# Patient Record
Sex: Male | Born: 1975 | Race: Black or African American | Hispanic: No | Marital: Single | Smoking: Current every day smoker
Health system: Southern US, Community
[De-identification: ages and names within clinical notes are randomized; demographics above are authoritative.]

---

## 2008-06-18 ENCOUNTER — Ambulatory Visit: Payer: Self-pay | Admitting: Family Medicine

## 2008-07-23 ENCOUNTER — Emergency Department: Payer: Self-pay | Admitting: Emergency Medicine

## 2009-07-13 ENCOUNTER — Emergency Department: Payer: Self-pay | Admitting: Emergency Medicine

## 2010-10-20 ENCOUNTER — Emergency Department: Payer: Self-pay | Admitting: Internal Medicine

## 2011-09-11 IMAGING — CT CT CERVICAL SPINE WITHOUT CONTRAST
1 series · 12 of 14 positions shown, 15 images · non-contrast
Comparison: None

REASON FOR EXAM: neck pain
COMMENTS:

PROCEDURE:     CT  - CT CERVICAL SPINE WO  - October 20, 2010  [DATE]
RESULT:     Clinical Indication: Trauma
TECHNIQUE: Multiple axial CT images from the skull base to the mid vertebral
body of T1. obtained with sagittal and coronal reformatted images provided.

[Series 6: axial · axial · 0.33mm/px · z∈[-241,-81]mm · 12 of 97 slices shown, 15 images]
[im 8/97  soft-tissue]
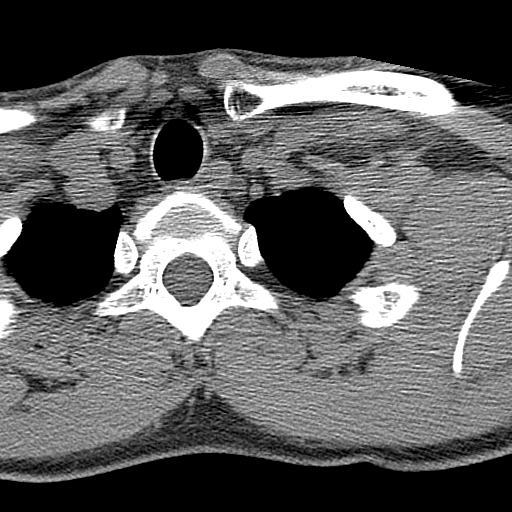
[im 8/97  bone]
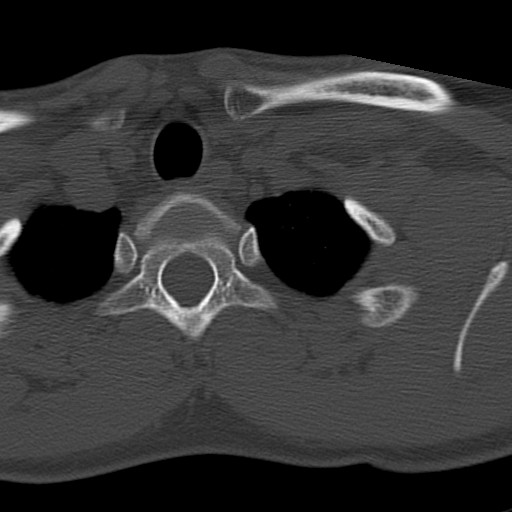
[im 15/97  bone]
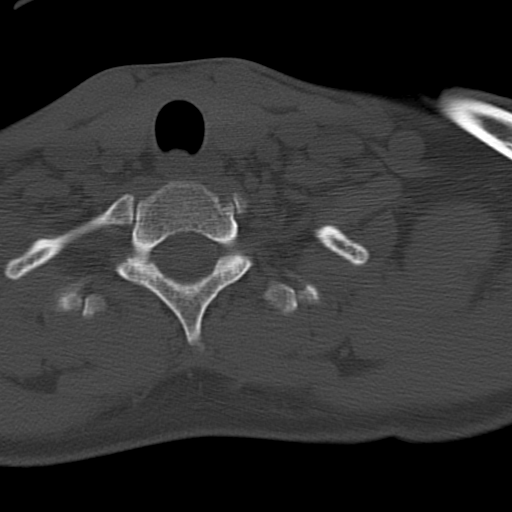
[im 23/97  bone]
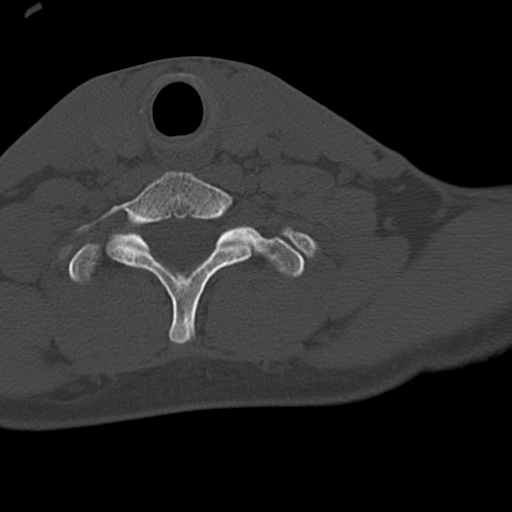
[im 30/97  bone]
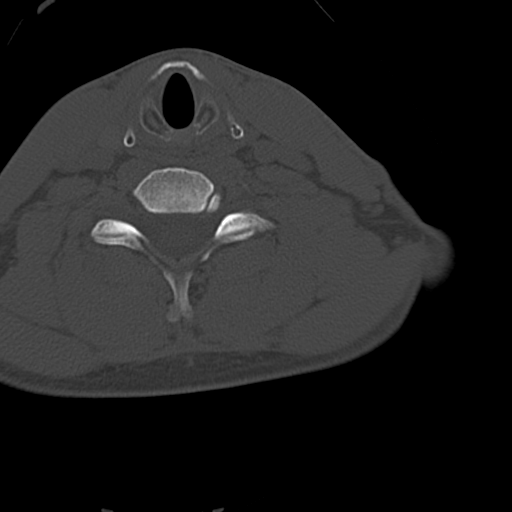
[im 37/97  soft-tissue]
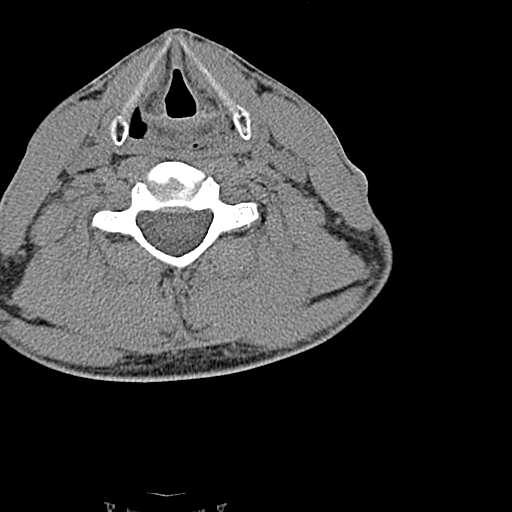
[im 37/97  bone]
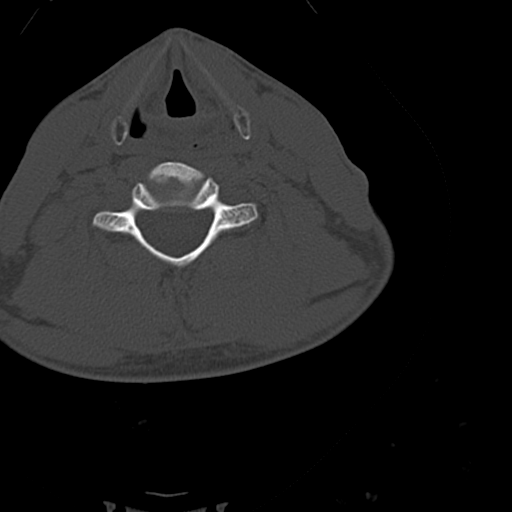
[im 45/97  bone]
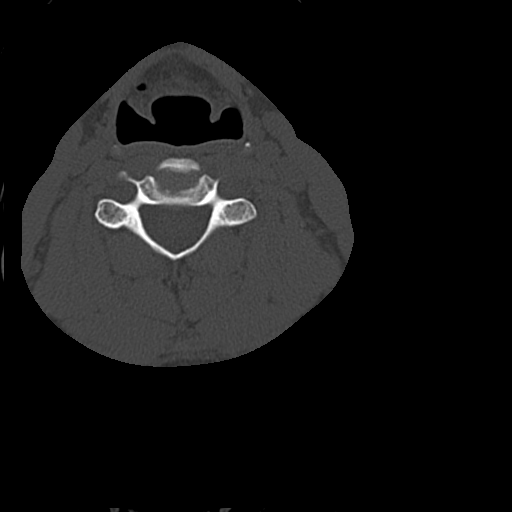
[im 52/97  bone]
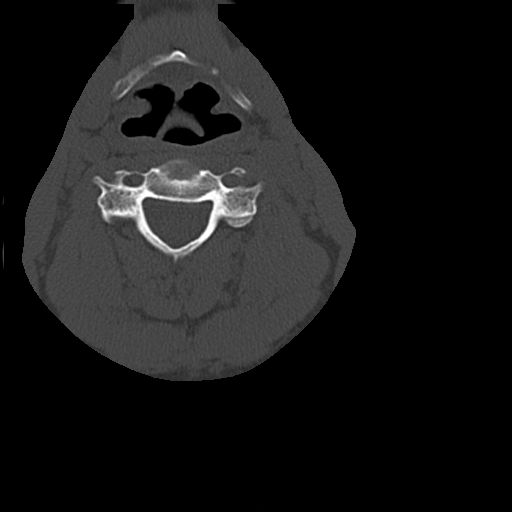
[im 60/97  bone]
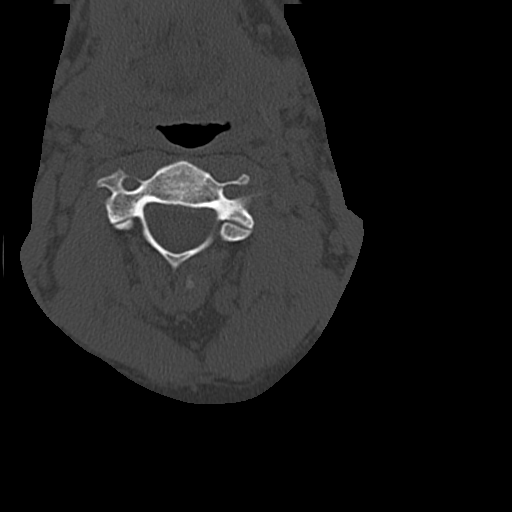
[im 67/97  soft-tissue]
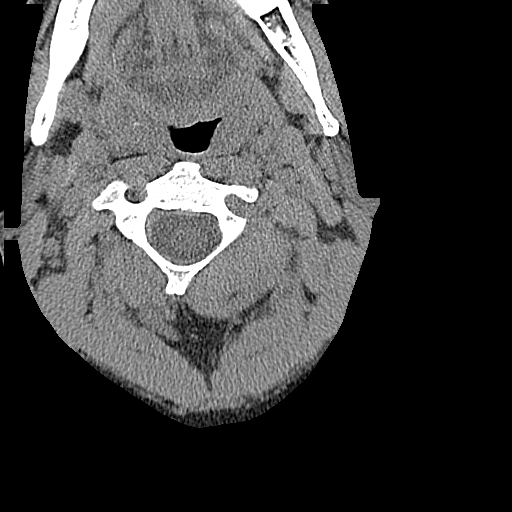
[im 67/97  bone]
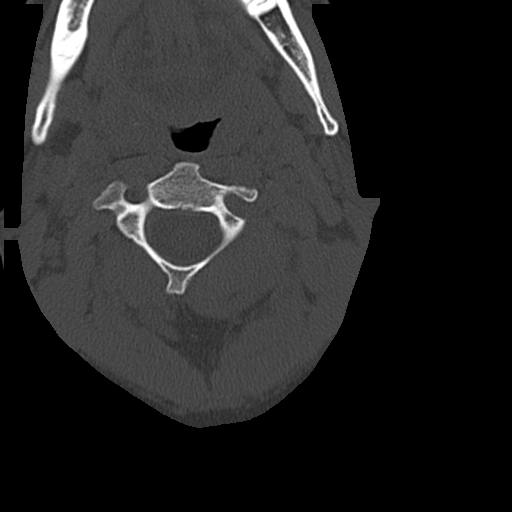
[im 74/97  bone]
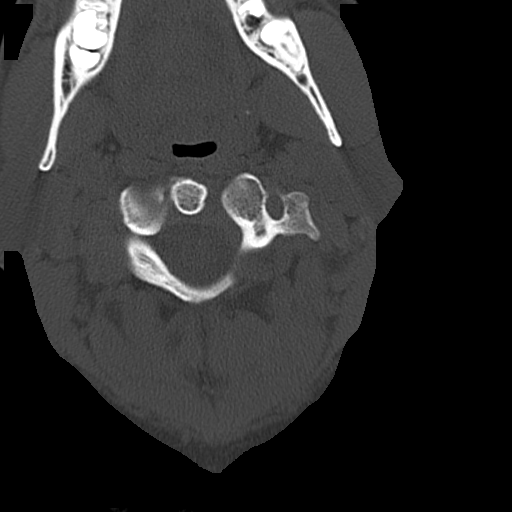
[im 82/97  bone]
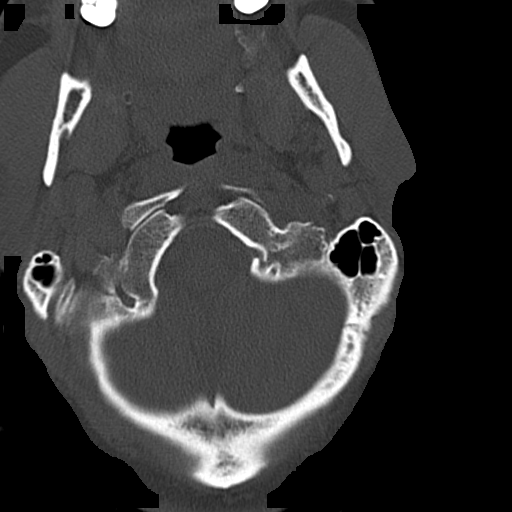
[im 89/97  bone]
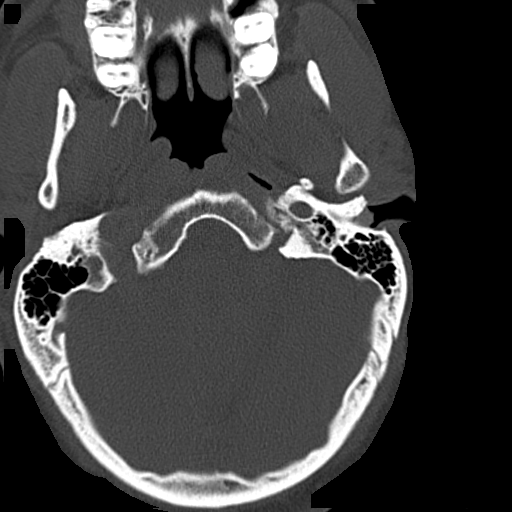

[12 of 14 positions shown; findings below may reference images not displayed]

FINDINGS: The alignment is anatomic. The vertebral body heights are maintained. There
is no acute fracture or static listhesis. The prevertebral soft tissues are
normal. The intraspinal soft tissues are not fully imaged on this
examination due to poor soft tissue contrast, but there is no soft tissue
gross abnormality.

The disc spaces are maintained.

The visualized portions of the lung apices demonstrate no focal abnormality.
IMPRESSION: 1. No acute osseous injury of the cervical spine.

2. Ligamentous injury is not evaluated. If there is high clinical concern
for ligamentous injury, consider MRI or flexion/extension radiographs as
clinically indicated and tolerated.

## 2012-05-15 ENCOUNTER — Emergency Department: Payer: Self-pay | Admitting: Emergency Medicine

## 2013-04-06 IMAGING — CR DG WRIST COMPLETE 3+V*R*
1 series · 4 of 4 positions shown · non-contrast
Comparison: none

REASON FOR EXAM: fall with wrist swelling and pain
COMMENTS:   LMP: (Male)

PROCEDURE:     DXR - DXR WRIST RT COMP WITH OBLIQUES  - May 15, 2012 [DATE]
RESULT:     Comparison: None

[Series 1: x wrist pa right · 0.14mm/px · 4 of 4 slices shown]
[im 1/4]
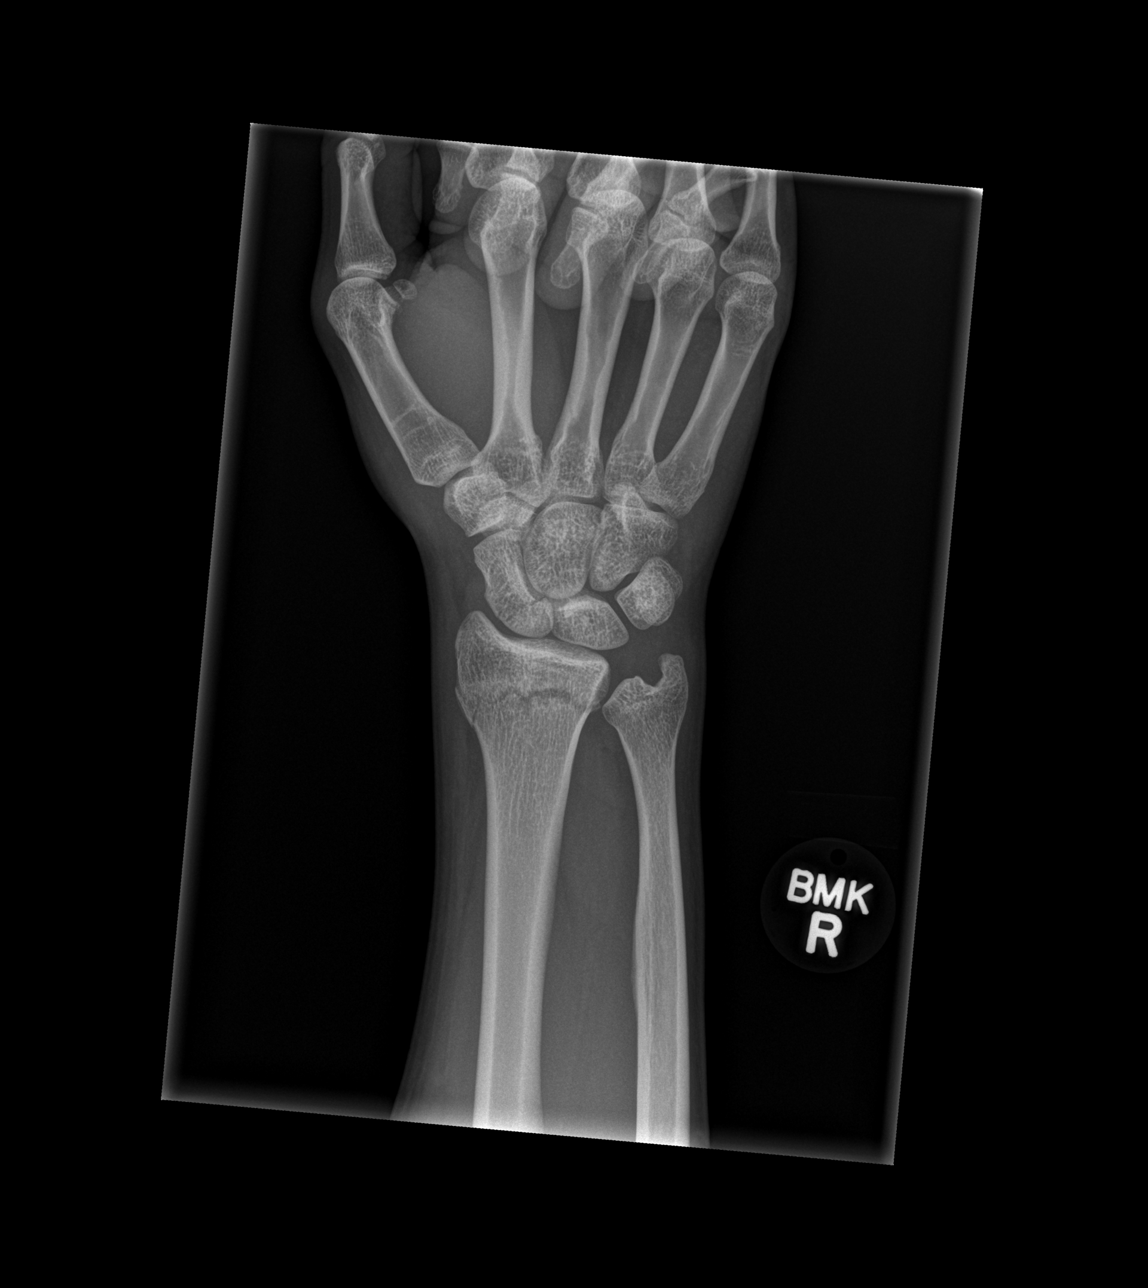
[im 2/4]
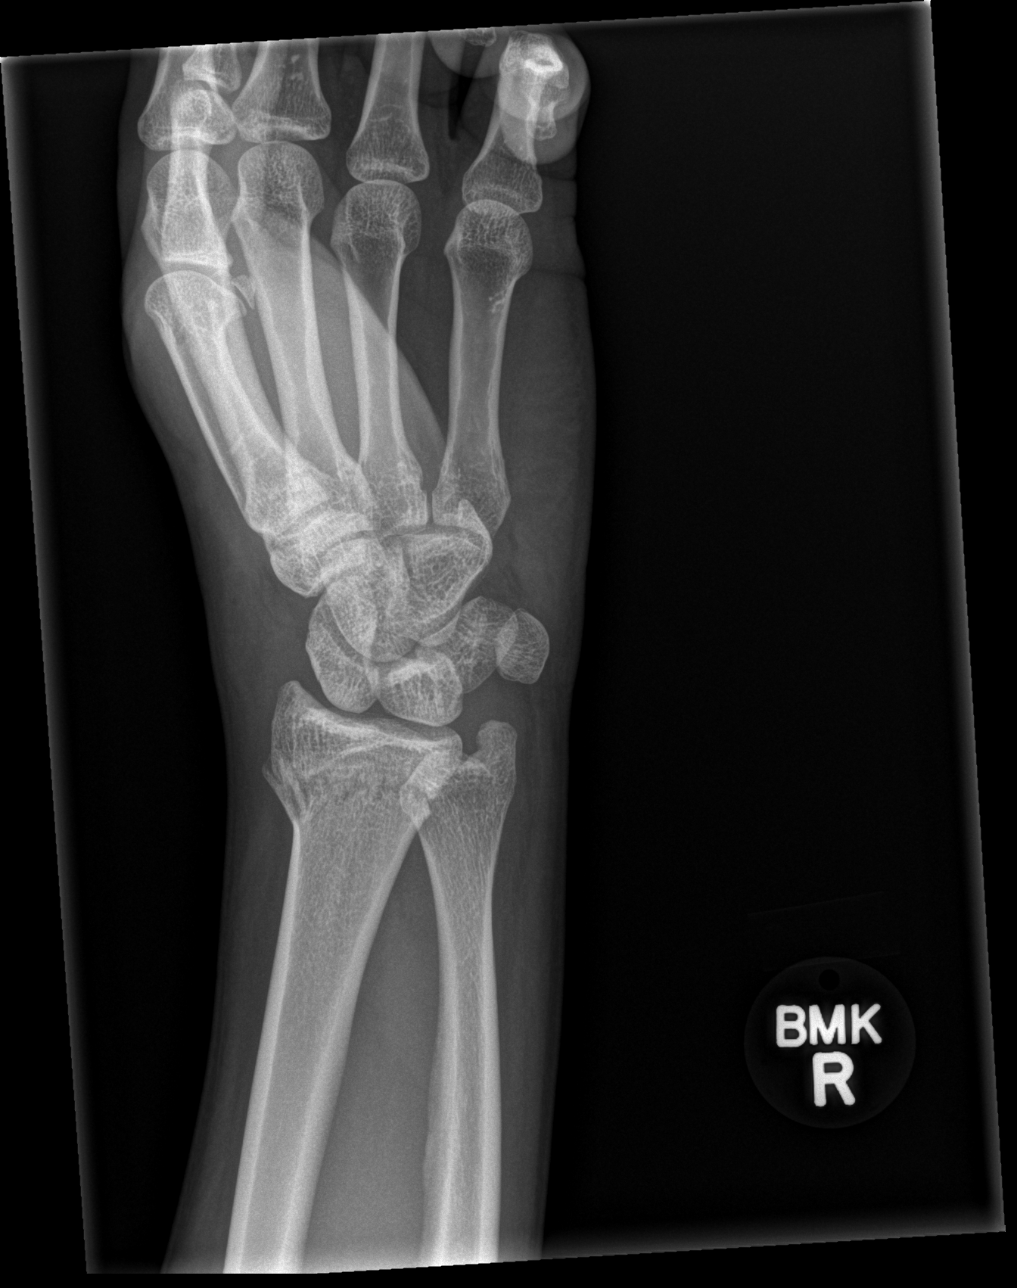
[im 3/4]
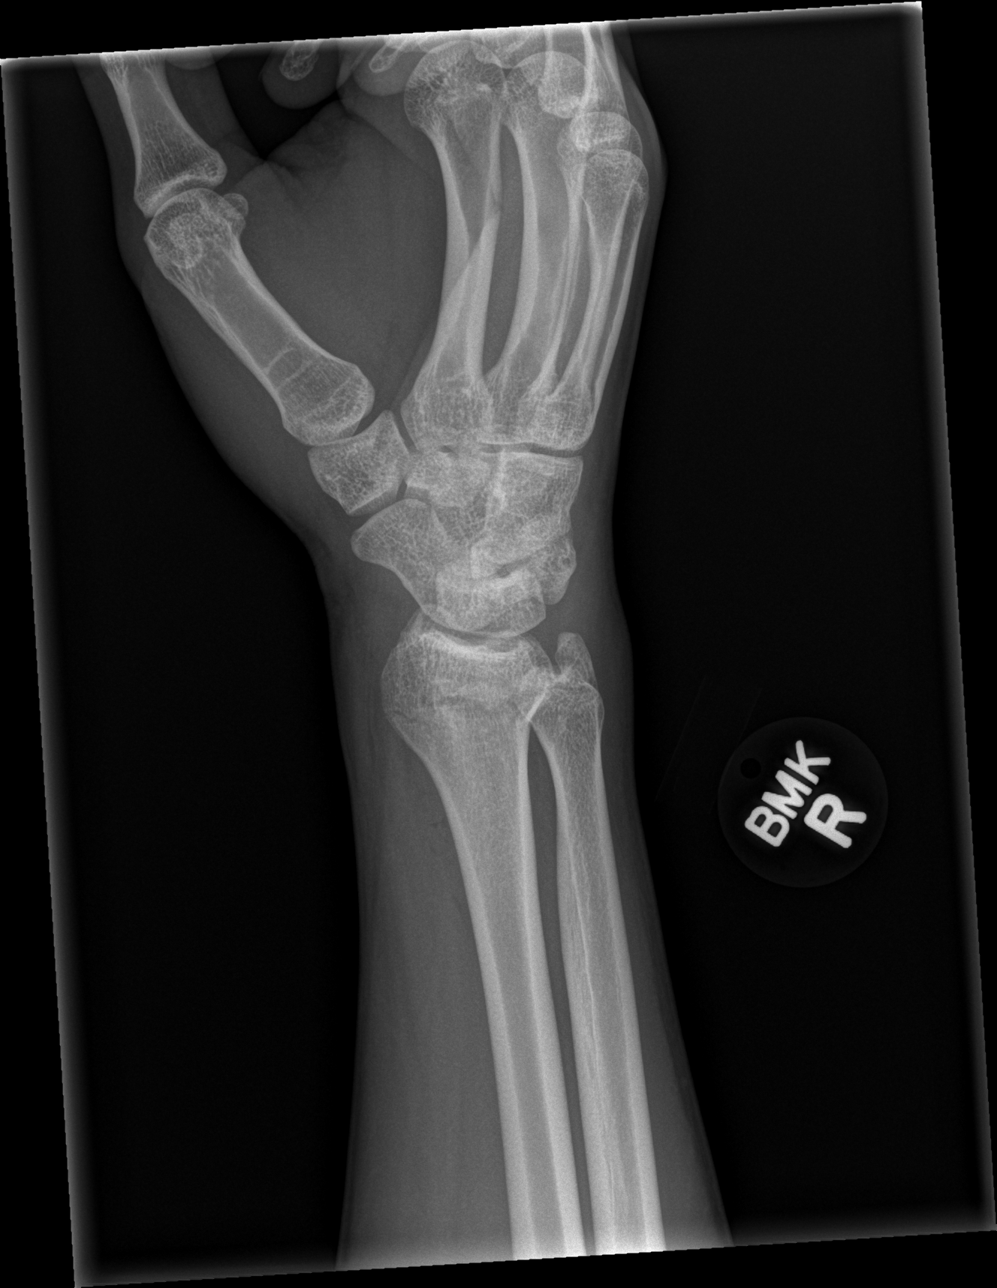
[im 4/4]
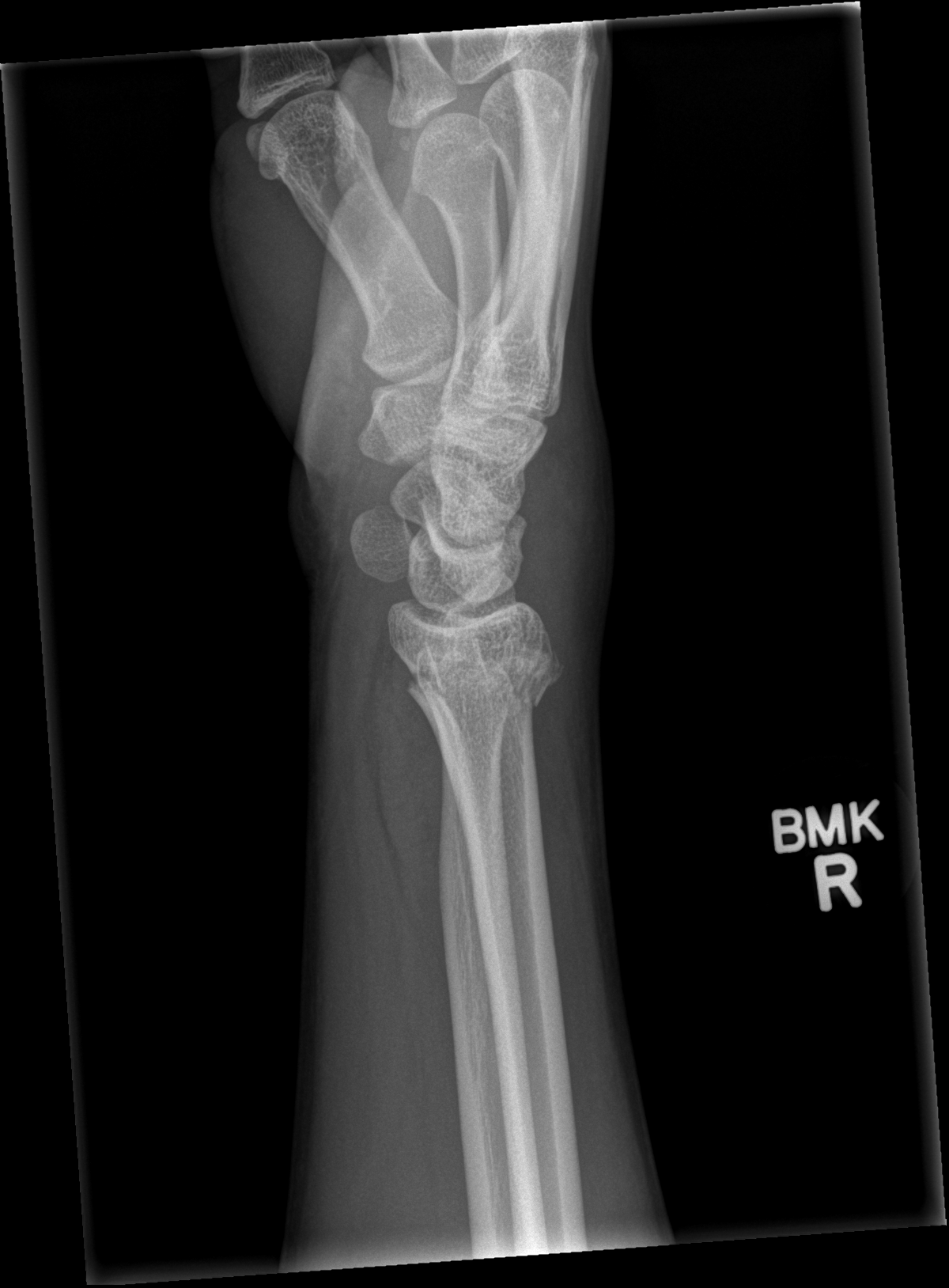

[4 of 4 positions shown; findings below may reference images not displayed]

FINDINGS: Four views of the right wrist demonstrates a mildly comminuted fracture of
the distal radial metaphysis. The fracture cleft extends to the distal
radial ulnar joint. There is no angulation. The joint spaces are maintained.
IMPRESSION: Mildly comminuted fracture of the right distal radial metaphysis.

If there is clinical concern regarding a radiographically occult scaphoid
fracture, snuff box tenderness, or ligamentous injury, further assessment
with MRI is recommended.

[REDACTED]

## 2019-03-07 ENCOUNTER — Other Ambulatory Visit: Payer: Self-pay

## 2019-03-07 ENCOUNTER — Ambulatory Visit: Payer: Managed Care, Other (non HMO) | Attending: Internal Medicine

## 2019-05-27 ENCOUNTER — Ambulatory Visit: Payer: Self-pay | Admitting: Nurse Practitioner

## 2019-05-28 ENCOUNTER — Telehealth: Payer: Self-pay | Admitting: Nurse Practitioner

## 2019-05-28 NOTE — Telephone Encounter (Signed)
Scheduled 06/06/19 10:30 advised of no show policy

## 2019-05-28 NOTE — Telephone Encounter (Signed)
Thank you. Noted.

## 2019-05-28 NOTE — Telephone Encounter (Signed)
Okay to reschedule x 1.

## 2019-05-28 NOTE — Telephone Encounter (Signed)
Pt presented in office today thinking that his appt was today at 2:00, advised pt that his appt was yesterday 05/27/19 at 2:00. Advised pt that normally we are unable to r/s, is it ok to r/s? Please advise.

## 2019-06-06 ENCOUNTER — Ambulatory Visit (INDEPENDENT_AMBULATORY_CARE_PROVIDER_SITE_OTHER): Payer: Managed Care, Other (non HMO) | Admitting: Nurse Practitioner

## 2019-06-06 ENCOUNTER — Encounter: Payer: Self-pay | Admitting: Nurse Practitioner

## 2019-06-06 ENCOUNTER — Other Ambulatory Visit: Payer: Self-pay

## 2019-06-06 VITALS — BP 146/95 | HR 69 | Temp 98.4°F | Ht 68.2 in | Wt 147.0 lb

## 2019-06-06 DIAGNOSIS — Z7289 Other problems related to lifestyle: Secondary | ICD-10-CM

## 2019-06-06 DIAGNOSIS — F1290 Cannabis use, unspecified, uncomplicated: Secondary | ICD-10-CM

## 2019-06-06 DIAGNOSIS — R030 Elevated blood-pressure reading, without diagnosis of hypertension: Secondary | ICD-10-CM | POA: Insufficient documentation

## 2019-06-06 DIAGNOSIS — Z7689 Persons encountering health services in other specified circumstances: Secondary | ICD-10-CM

## 2019-06-06 DIAGNOSIS — Z789 Other specified health status: Secondary | ICD-10-CM | POA: Insufficient documentation

## 2019-06-06 DIAGNOSIS — F17220 Nicotine dependence, chewing tobacco, uncomplicated: Secondary | ICD-10-CM | POA: Diagnosis not present

## 2019-06-06 DIAGNOSIS — F1090 Alcohol use, unspecified, uncomplicated: Secondary | ICD-10-CM

## 2019-06-06 NOTE — Assessment & Plan Note (Signed)
BP elevated in office today and at recent UC visit on review.  Suspect some underlying HTN, family history of this.  Recommend heavy focus on DASH diet and cutting back on smoking and alcohol intake over next 4 weeks.  Advised him to monitor BP at few days a week at home and document for provider.  If ongoing elevation at home and in office in 4 weeks, discussed possible need to initiate medication -- consider Losartan or Amlodipine.  Return in 4 weeks for annual physical and labs.

## 2019-06-06 NOTE — Assessment & Plan Note (Signed)
Drinks two 32 oz beer a day, recommend cutting back on this which would be beneficial to BP and overall health.  Will obtain labs next visit, check LFT.

## 2019-06-06 NOTE — Assessment & Plan Note (Signed)
I have recommended complete cessation of tobacco use. I have discussed various options available for assistance with tobacco cessation including over the counter methods (Nicotine gum, patch and lozenges). We also discussed prescription options (Chantix, Nicotine Inhaler / Nasal Spray). The patient is not interested in pursuing any prescription tobacco cessation options at this time.  He is working on cutting back at this time.

## 2019-06-06 NOTE — Assessment & Plan Note (Signed)
Daily use, recommend cut back on this for overall health benefit.

## 2019-06-06 NOTE — Patient Instructions (Signed)
DASH Eating Plan DASH stands for "Dietary Approaches to Stop Hypertension." The DASH eating plan is a healthy eating plan that has been shown to reduce high blood pressure (hypertension). It may also reduce your risk for type 2 diabetes, heart disease, and stroke. The DASH eating plan may also help with weight loss. What are tips for following this plan?  General guidelines  Avoid eating more than 2,300 mg (milligrams) of salt (sodium) a day. If you have hypertension, you may need to reduce your sodium intake to 1,500 mg a day.  Limit alcohol intake to no more than 1 drink a day for nonpregnant women and 2 drinks a day for men. One drink equals 12 oz of beer, 5 oz of wine, or 1 oz of hard liquor.  Work with your health care provider to maintain a healthy body weight or to lose weight. Ask what an ideal weight is for you.  Get at least 30 minutes of exercise that causes your heart to beat faster (aerobic exercise) most days of the week. Activities may include walking, swimming, or biking.  Work with your health care provider or diet and nutrition specialist (dietitian) to adjust your eating plan to your individual calorie needs. Reading food labels   Check food labels for the amount of sodium per serving. Choose foods with less than 5 percent of the Daily Value of sodium. Generally, foods with less than 300 mg of sodium per serving fit into this eating plan.  To find whole grains, look for the word "whole" as the first word in the ingredient list. Shopping  Buy products labeled as "low-sodium" or "no salt added."  Buy fresh foods. Avoid canned foods and premade or frozen meals. Cooking  Avoid adding salt when cooking. Use salt-free seasonings or herbs instead of table salt or sea salt. Check with your health care provider or pharmacist before using salt substitutes.  Do not fry foods. Cook foods using healthy methods such as baking, boiling, grilling, and broiling instead.  Cook with  heart-healthy oils, such as olive, canola, soybean, or sunflower oil. Meal planning  Eat a balanced diet that includes: ? 5 or more servings of fruits and vegetables each day. At each meal, try to fill half of your plate with fruits and vegetables. ? Up to 6-8 servings of whole grains each day. ? Less than 6 oz of lean meat, poultry, or fish each day. A 3-oz serving of meat is about the same size as a deck of cards. One egg equals 1 oz. ? 2 servings of low-fat dairy each day. ? A serving of nuts, seeds, or beans 5 times each week. ? Heart-healthy fats. Healthy fats called Omega-3 fatty acids are found in foods such as flaxseeds and coldwater fish, like sardines, salmon, and mackerel.  Limit how much you eat of the following: ? Canned or prepackaged foods. ? Food that is high in trans fat, such as fried foods. ? Food that is high in saturated fat, such as fatty meat. ? Sweets, desserts, sugary drinks, and other foods with added sugar. ? Full-fat dairy products.  Do not salt foods before eating.  Try to eat at least 2 vegetarian meals each week.  Eat more home-cooked food and less restaurant, buffet, and fast food.  When eating at a restaurant, ask that your food be prepared with less salt or no salt, if possible. What foods are recommended? The items listed may not be a complete list. Talk with your dietitian about   what dietary choices are best for you. Grains Whole-grain or whole-wheat bread. Whole-grain or whole-wheat pasta. Brown rice. Oatmeal. Quinoa. Bulgur. Whole-grain and low-sodium cereals. Pita bread. Low-fat, low-sodium crackers. Whole-wheat flour tortillas. Vegetables Fresh or frozen vegetables (raw, steamed, roasted, or grilled). Low-sodium or reduced-sodium tomato and vegetable juice. Low-sodium or reduced-sodium tomato sauce and tomato paste. Low-sodium or reduced-sodium canned vegetables. Fruits All fresh, dried, or frozen fruit. Canned fruit in natural juice (without  added sugar). Meat and other protein foods Skinless chicken or turkey. Ground chicken or turkey. Pork with fat trimmed off. Fish and seafood. Egg whites. Dried beans, peas, or lentils. Unsalted nuts, nut butters, and seeds. Unsalted canned beans. Lean cuts of beef with fat trimmed off. Low-sodium, lean deli meat. Dairy Low-fat (1%) or fat-free (skim) milk. Fat-free, low-fat, or reduced-fat cheeses. Nonfat, low-sodium ricotta or cottage cheese. Low-fat or nonfat yogurt. Low-fat, low-sodium cheese. Fats and oils Soft margarine without trans fats. Vegetable oil. Low-fat, reduced-fat, or light mayonnaise and salad dressings (reduced-sodium). Canola, safflower, olive, soybean, and sunflower oils. Avocado. Seasoning and other foods Herbs. Spices. Seasoning mixes without salt. Unsalted popcorn and pretzels. Fat-free sweets. What foods are not recommended? The items listed may not be a complete list. Talk with your dietitian about what dietary choices are best for you. Grains Baked goods made with fat, such as croissants, muffins, or some breads. Dry pasta or rice meal packs. Vegetables Creamed or fried vegetables. Vegetables in a cheese sauce. Regular canned vegetables (not low-sodium or reduced-sodium). Regular canned tomato sauce and paste (not low-sodium or reduced-sodium). Regular tomato and vegetable juice (not low-sodium or reduced-sodium). Pickles. Olives. Fruits Canned fruit in a light or heavy syrup. Fried fruit. Fruit in cream or butter sauce. Meat and other protein foods Fatty cuts of meat. Ribs. Fried meat. Bacon. Sausage. Bologna and other processed lunch meats. Salami. Fatback. Hotdogs. Bratwurst. Salted nuts and seeds. Canned beans with added salt. Canned or smoked fish. Whole eggs or egg yolks. Chicken or turkey with skin. Dairy Whole or 2% milk, cream, and half-and-half. Whole or full-fat cream cheese. Whole-fat or sweetened yogurt. Full-fat cheese. Nondairy creamers. Whipped toppings.  Processed cheese and cheese spreads. Fats and oils Butter. Stick margarine. Lard. Shortening. Ghee. Bacon fat. Tropical oils, such as coconut, palm kernel, or palm oil. Seasoning and other foods Salted popcorn and pretzels. Onion salt, garlic salt, seasoned salt, table salt, and sea salt. Worcestershire sauce. Tartar sauce. Barbecue sauce. Teriyaki sauce. Soy sauce, including reduced-sodium. Steak sauce. Canned and packaged gravies. Fish sauce. Oyster sauce. Cocktail sauce. Horseradish that you find on the shelf. Ketchup. Mustard. Meat flavorings and tenderizers. Bouillon cubes. Hot sauce and Tabasco sauce. Premade or packaged marinades. Premade or packaged taco seasonings. Relishes. Regular salad dressings. Where to find more information:  National Heart, Lung, and Blood Institute: www.nhlbi.nih.gov  American Heart Association: www.heart.org Summary  The DASH eating plan is a healthy eating plan that has been shown to reduce high blood pressure (hypertension). It may also reduce your risk for type 2 diabetes, heart disease, and stroke.  With the DASH eating plan, you should limit salt (sodium) intake to 2,300 mg a day. If you have hypertension, you may need to reduce your sodium intake to 1,500 mg a day.  When on the DASH eating plan, aim to eat more fresh fruits and vegetables, whole grains, lean proteins, low-fat dairy, and heart-healthy fats.  Work with your health care provider or diet and nutrition specialist (dietitian) to adjust your eating plan to your   individual calorie needs. This information is not intended to replace advice given to you by your health care provider. Make sure you discuss any questions you have with your health care provider. Document Revised: 01/26/2017 Document Reviewed: 02/07/2016 Elsevier Patient Education  2020 Elsevier Inc.  

## 2019-06-06 NOTE — Progress Notes (Signed)
New Patient Office Visit  Subjective:  Patient ID: Donald Oconnor, male    DOB: 18-Sep-1975  Age: 44 y.o. MRN: [id number]  CC:  Chief Complaint  Patient presents with  . Establish Care    HPI Donald Oconnor presents for new patient visit to establish care.  Introduced to Designer, jewellery role and practice setting.  All questions answered.  Has not had provider in past.    Denies any major health issues.  Does smoke MJ daily + drinks two 32 oz beer daily + smokes < 1/2 PPD, has smoked since age 24.  Went cold [place] September of last year, but started back at small amount and is working on cutting back.    History reviewed. No pertinent past medical history.  History reviewed. No pertinent surgical history.  Family History  Problem Relation Age of Onset  . Diabetes Mother   . Hypertension Mother   . Diabetes Brother   . Diabetes Maternal Grandmother   . Hypertension Maternal Grandfather     Social History   Socioeconomic History  . Marital status: Single    Spouse name: Not on file  . Number of children: Not on file  . Years of education: Not on file  . Highest education level: Not on file  Occupational History  . Not on file  Tobacco Use  . Smoking status: Current Every Day Smoker    Packs/day: 0.25    Types: Cigarettes  . Smokeless tobacco: Never Used  Substance and Sexual Activity  . Alcohol use: Yes    Alcohol/week: 14.0 standard drinks    Types: 14 Cans of beer per week    Comment: two 32 oz a day  . Drug use: Yes    Types: Marijuana    Comment: daily  . Sexual activity: Yes  Other Topics Concern  . Not on file  Social History Narrative  . Not on file   Social Determinants of Health   Financial Resource Strain: Low Risk   . Difficulty of Paying Living Expenses: Not hard at all  Food Insecurity: No Food Insecurity  . Worried About Charity fundraiser in the Last Year: Never true  . Ran Out of Food in the Last Year: Never true  Transportation  Needs: No Transportation Needs  . Lack of Transportation (Medical): No  . Lack of Transportation (Non-Medical): No  Physical Activity: Inactive  . Days of Exercise per Week: 0 days  . Minutes of Exercise per Session: 0 min  Stress: No Stress Concern Present  . Feeling of Stress : Not at all  Social Connections:   . Frequency of Communication with Friends and Family:   . Frequency of Social Gatherings with Friends and Family:   . Attends Religious Services:   . Active Member of Clubs or Organizations:   . Attends Archivist Meetings:   Marland Kitchen Marital Status:   Intimate Partner Violence:   . Fear of Current or Ex-Partner:   . Emotionally Abused:   Marland Kitchen Physically Abused:   . Sexually Abused:     ROS Review of Systems  Constitutional: Negative for activity change, diaphoresis, fatigue and fever.  Respiratory: Negative for cough, chest tightness, shortness of breath and wheezing.   Cardiovascular: Negative for chest pain, palpitations and leg swelling.  Gastrointestinal: Negative.   Endocrine: Negative.   Neurological: Negative.   Psychiatric/Behavioral: Negative.     Objective:   Today's Vitals: BP (!) 146/95   Pulse 69  Temp 98.4 F (36.9 C) (Oral)   Ht 5' 8.2" (1.732 m)   Wt 147 lb (66.7 kg)   SpO2 96%   BMI 22.22 kg/m   Physical Exam Vitals and nursing note reviewed.  Constitutional:      General: He is awake. He is not in acute distress.    Appearance: He is well-developed and well-groomed. He is not ill-appearing.  HENT:     Head: Normocephalic and atraumatic.     Right Ear: Hearing normal. No drainage.     Left Ear: Hearing normal. No drainage.  Eyes:     General: Lids are normal.        Right eye: No discharge.        Left eye: No discharge.     Conjunctiva/sclera: Conjunctivae normal.     Pupils: Pupils are equal, round, and reactive to light.  Neck:     Thyroid: No thyromegaly.     Vascular: No carotid bruit.  Cardiovascular:     Rate and  Rhythm: Normal rate and regular rhythm.     Heart sounds: Normal heart sounds, S1 normal and S2 normal. No murmur. No gallop.   Pulmonary:     Effort: Pulmonary effort is normal. No accessory muscle usage or respiratory distress.     Breath sounds: Normal breath sounds.  Abdominal:     General: Bowel sounds are normal.     Palpations: Abdomen is soft.  Musculoskeletal:        General: Normal range of motion.     Cervical back: Normal range of motion and neck supple.     Right lower leg: No edema.     Left lower leg: No edema.  Skin:    General: Skin is warm and dry.     Capillary Refill: Capillary refill takes less than 2 seconds.  Neurological:     Mental Status: He is alert and oriented to person, place, and time.     Deep Tendon Reflexes: Reflexes are normal and symmetric.  Psychiatric:        Attention and Perception: Attention normal.        Mood and Affect: Mood normal.        Speech: Speech normal.        Behavior: Behavior normal. Behavior is cooperative.        Thought Content: Thought content normal.     Assessment & Plan:   Problem List Items Addressed This Visit      Other   Elevated BP without diagnosis of hypertension    BP elevated in office today and at recent UC visit on review.  Suspect some underlying HTN, family history of this.  Recommend heavy focus on DASH diet and cutting back on smoking and alcohol intake over next 4 weeks.  Advised him to monitor BP at few days a week at home and document for provider.  If ongoing elevation at home and in office in 4 weeks, discussed possible need to initiate medication -- consider Losartan or Amlodipine.  Return in 4 weeks for annual physical and labs.      Nicotine dependence, chewing tobacco, uncomplicated    I have recommended complete cessation of tobacco use. I have discussed various options available for assistance with tobacco cessation including over the counter methods (Nicotine gum, patch and lozenges). We  also discussed prescription options (Chantix, Nicotine Inhaler / Nasal Spray). The patient is not interested in pursuing any prescription tobacco cessation options at this time.  He  is working on cutting back at this time.       Alcohol use    Drinks two 32 oz beer a day, recommend cutting back on this which would be beneficial to BP and overall health.  Will obtain labs next visit, check LFT.      Marijuana user    Daily use, recommend cut back on this for overall health benefit.       Other Visit Diagnoses    Encounter to establish care    -  Primary      No outpatient encounter medications on file as of 06/06/2019.   No facility-administered encounter medications on file as of 06/06/2019.    Follow-up: Return in about 4 weeks (around 07/04/2019) for Annual physical.   Venita Lick, NP

## 2019-07-04 ENCOUNTER — Encounter: Payer: Self-pay | Admitting: Nurse Practitioner

## 2019-07-04 ENCOUNTER — Other Ambulatory Visit: Payer: Self-pay

## 2019-07-04 ENCOUNTER — Ambulatory Visit (INDEPENDENT_AMBULATORY_CARE_PROVIDER_SITE_OTHER): Payer: Managed Care, Other (non HMO) | Admitting: Nurse Practitioner

## 2019-07-04 VITALS — BP 124/85 | HR 72 | Temp 98.8°F | Ht 70.0 in | Wt 146.6 lb

## 2019-07-04 DIAGNOSIS — Z13220 Encounter for screening for lipoid disorders: Secondary | ICD-10-CM

## 2019-07-04 DIAGNOSIS — Z1329 Encounter for screening for other suspected endocrine disorder: Secondary | ICD-10-CM

## 2019-07-04 DIAGNOSIS — Z7289 Other problems related to lifestyle: Secondary | ICD-10-CM

## 2019-07-04 DIAGNOSIS — M545 Low back pain, unspecified: Secondary | ICD-10-CM

## 2019-07-04 DIAGNOSIS — Z125 Encounter for screening for malignant neoplasm of prostate: Secondary | ICD-10-CM | POA: Diagnosis not present

## 2019-07-04 DIAGNOSIS — F17220 Nicotine dependence, chewing tobacco, uncomplicated: Secondary | ICD-10-CM | POA: Diagnosis not present

## 2019-07-04 DIAGNOSIS — R030 Elevated blood-pressure reading, without diagnosis of hypertension: Secondary | ICD-10-CM | POA: Diagnosis not present

## 2019-07-04 DIAGNOSIS — Z789 Other specified health status: Secondary | ICD-10-CM

## 2019-07-04 DIAGNOSIS — F1290 Cannabis use, unspecified, uncomplicated: Secondary | ICD-10-CM

## 2019-07-04 DIAGNOSIS — M549 Dorsalgia, unspecified: Secondary | ICD-10-CM | POA: Insufficient documentation

## 2019-07-04 DIAGNOSIS — Z0000 Encounter for general adult medical examination without abnormal findings: Secondary | ICD-10-CM

## 2019-07-04 MED ORDER — METHOCARBAMOL 500 MG PO TABS: 500.0000 mg | ORAL_TABLET | Freq: Three times a day (TID) | ORAL | 0 refills | Status: DC | PRN

## 2019-07-04 NOTE — Assessment & Plan Note (Signed)
Daily use, recommend cut back on this for overall health benefit.

## 2019-07-04 NOTE — Patient Instructions (Signed)
Healthy Eating Following a healthy eating pattern may help you to achieve and maintain a healthy body weight, reduce the risk of chronic disease, and live a long and productive life. It is important to follow a healthy eating pattern at an appropriate calorie level for your body. Your nutritional needs should be met primarily through food by choosing a variety of nutrient-rich foods. What are tips for following this plan? Reading food labels  Read labels and choose the following: ? Reduced or low sodium. ? Juices with 100% fruit juice. ? Foods with low saturated fats and high polyunsaturated and monounsaturated fats. ? Foods with whole grains, such as whole wheat, cracked wheat, brown rice, and wild rice. ? Whole grains that are fortified with folic acid. This is recommended for women who are pregnant or who want to become pregnant.  Read labels and avoid the following: ? Foods with a lot of added sugars. These include foods that contain brown sugar, corn sweetener, corn syrup, dextrose, fructose, glucose, high-fructose corn syrup, honey, invert sugar, lactose, malt syrup, maltose, molasses, raw sugar, sucrose, trehalose, or turbinado sugar.  Do not eat more than the following amounts of added sugar per day:  6 teaspoons (25 g) for women.  9 teaspoons (38 g) for men. ? Foods that contain processed or refined starches and grains. ? Refined grain products, such as white flour, degermed cornmeal, white bread, and white rice. Shopping  Choose nutrient-rich snacks, such as vegetables, whole fruits, and nuts. Avoid high-calorie and high-sugar snacks, such as potato chips, fruit snacks, and candy.  Use oil-based dressings and spreads on foods instead of solid fats such as butter, stick margarine, or cream cheese.  Limit pre-made sauces, mixes, and "instant" products such as flavored rice, instant noodles, and ready-made pasta.  Try more plant-protein sources, such as tofu, tempeh, black beans,  edamame, lentils, nuts, and seeds.  Explore eating plans such as the Mediterranean diet or vegetarian diet. Cooking  Use oil to saut or stir-fry foods instead of solid fats such as butter, stick margarine, or lard.  Try baking, boiling, grilling, or broiling instead of frying.  Remove the fatty part of meats before cooking.  Steam vegetables in water or broth. Meal planning   At meals, imagine dividing your plate into fourths: ? One-half of your plate is fruits and vegetables. ? One-fourth of your plate is whole grains. ? One-fourth of your plate is protein, especially lean meats, poultry, eggs, tofu, beans, or nuts.  Include low-fat dairy as part of your daily diet. Lifestyle  Choose healthy options in all settings, including home, work, school, restaurants, or stores.  Prepare your food safely: ? Wash your hands after handling raw meats. ? Keep food preparation surfaces clean by regularly washing with hot, soapy water. ? Keep raw meats separate from ready-to-eat foods, such as fruits and vegetables. ? Cook seafood, meat, poultry, and eggs to the recommended internal temperature. ? Store foods at safe temperatures. In general:  Keep cold foods at 59F (4.4C) or below.  Keep hot foods at 159F (60C) or above.  Keep your freezer at [facility] (-17.8C) or below.  Foods are no longer safe to eat when they have been between the temperatures of 40-159F (4.4-60C) for more than 2 hours. What foods should I eat? Fruits Aim to eat 2 cup-equivalents of fresh, canned (in natural juice), or frozen fruits each day. Examples of 1 cup-equivalent of fruit include 1 small apple, 8 large strawberries, 1 cup canned fruit,  cup  dried fruit, or 1 cup 100% juice. Vegetables Aim to eat 2-3 cup-equivalents of fresh and frozen vegetables each day, including different varieties and colors. Examples of 1 cup-equivalent of vegetables include 2 medium carrots, 2 cups raw, leafy greens, 1 cup chopped  vegetable (raw or cooked), or 1 medium baked potato. Grains Aim to eat 6 ounce-equivalents of whole grains each day. Examples of 1 ounce-equivalent of grains include 1 slice of bread, 1 cup ready-to-eat cereal, 3 cups popcorn, or  cup cooked rice, pasta, or cereal. Meats and other proteins Aim to eat 5-6 ounce-equivalents of protein each day. Examples of 1 ounce-equivalent of protein include 1 egg, 1/2 cup nuts or seeds, or 1 tablespoon (16 g) peanut butter. A cut of meat or fish that is the size of a deck of cards is about 3-4 ounce-equivalents.  Of the protein you eat each week, try to have at least 8 ounces come from seafood. This includes salmon, trout, herring, and anchovies. Dairy Aim to eat 3 cup-equivalents of fat-free or low-fat dairy each day. Examples of 1 cup-equivalent of dairy include 1 cup (240 mL) milk, 8 ounces (250 g) yogurt, 1 ounces (44 g) natural cheese, or 1 cup (240 mL) fortified soy milk. Fats and oils  Aim for about 5 teaspoons (21 g) per day. Choose monounsaturated fats, such as canola and olive oils, avocados, peanut butter, and most nuts, or polyunsaturated fats, such as sunflower, corn, and soybean oils, walnuts, pine nuts, sesame seeds, sunflower seeds, and flaxseed. Beverages  Aim for six 8-oz glasses of water per day. Limit coffee to three to five 8-oz cups per day.  Limit caffeinated beverages that have added calories, such as soda and energy drinks.  Limit alcohol intake to no more than 1 drink a day for nonpregnant women and 2 drinks a day for men. One drink equals 12 oz of beer (355 mL), 5 oz of wine (148 mL), or 1 oz of hard liquor (44 mL). Seasoning and other foods  Avoid adding excess amounts of salt to your foods. Try flavoring foods with herbs and spices instead of salt.  Avoid adding sugar to foods.  Try using oil-based dressings, sauces, and spreads instead of solid fats. This information is based on general [place] nutrition guidelines. For more  information, visit [employer]. Exact amounts may vary based on your nutrition needs. Summary  A healthy eating plan may help you to maintain a healthy weight, reduce the risk of chronic diseases, and stay active throughout your life.  Plan your meals. Make sure you eat the right portions of a variety of nutrient-rich foods.  Try baking, boiling, grilling, or broiling instead of frying.  Choose healthy options in all settings, including home, work, school, restaurants, or stores. This information is not intended to replace advice given to you by your health care provider. Make sure you discuss any questions you have with your health care provider. Document Revised: 05/28/2017 Document Reviewed: 05/28/2017 Elsevier Patient Education  [place].

## 2019-07-04 NOTE — Assessment & Plan Note (Signed)
BP at goal today.  No medications.  Continue DASH diet focus and monitoring at home.  CBC and TSH today.

## 2019-07-04 NOTE — Assessment & Plan Note (Signed)
Drinks two 32 oz beer a day, recommend cutting back on this which would be beneficial to BP and overall health.  Will obtain CMP today.

## 2019-07-04 NOTE — Assessment & Plan Note (Signed)
I have recommended complete cessation of tobacco use. I have discussed various options available for assistance with tobacco cessation including over the counter methods (Nicotine gum, patch and lozenges). We also discussed prescription options (Chantix, Nicotine Inhaler / Nasal Spray). The patient is not interested in pursuing any prescription tobacco cessation options at this time.  

## 2019-07-04 NOTE — Assessment & Plan Note (Signed)
Acute x 24 hours.  No red flag symptoms.  Will send in short period of Robaxin, script sent.  Recommend alternate ice and heat at home.  May take Tylenol as needed for comfort.  Recommend gentle stretches, yoga at home to help keep back stable.  Return to office for worsening or ongoing pain.

## 2019-07-04 NOTE — Progress Notes (Signed)
BP 124/85 (BP Location: Left Arm, Cuff Size: Normal)   Pulse 72   Temp 98.8 F (37.1 C) (Oral)   Ht 5\' 10"  (1.778 m)   Wt 146 lb 9.6 oz (66.5 kg)   SpO2 98%   BMI 21.03 kg/m    Subjective:    Patient ID: Donald Oconnor, male    DOB: [birth year], 44 y.o.   MRN: [id number]  HPI: Donald Oconnor is a 44 y.o. male presenting on 07/04/2019 for comprehensive medical examination. Current medical complaints include:none  He currently lives with: none Interim Problems from his last visit: no   Continues to smoke, 4 cigarettes a day, is trying to quit on own and not interested in medications.  Drinks two 36 oz beer a day and is attempting to cut back.  BACK PAIN Yesterday was coating automotive header, as he was pulling it out he felt a pull to lower back.  Mainly to right lower side.  Has history of similar back issues, first time at 86-58 years old after sleeping on couch.  Gets back issues/pulls 2-3 times a year.  Has episodes since he was little of passing out in driveway, last one was a couple years ago.  Feels like when he stands up too fast, he gets dizzy.   Duration: days Mechanism of injury: lifting Location: Right and low back Onset: sudden Severity: 6/10 Quality: sharp, aching and throbbing Frequency: intermittent Radiation: none Aggravating factors: lifting, movement and walking Alleviating factors: nothing Status: stable Treatments attempted: none  Relief with NSAIDs?: No NSAIDs Taken Nighttime pain:  no Paresthesias / decreased sensation:  no Bowel / bladder incontinence:  no Fevers:  no Dysuria / urinary frequency:  no   Functional Status Survey: Is the patient deaf or have difficulty hearing?: No Does the patient have difficulty seeing, even when wearing glasses/contacts?: No Does the patient have difficulty concentrating, remembering, or making decisions?: No Does the patient have difficulty walking or climbing stairs?: No Does the patient have difficulty  dressing or bathing?: No Does the patient have difficulty doing errands alone such as visiting a doctor's office or shopping?: No  FALL RISK: Fall Risk  07/04/2019  Falls in the past year? 0  Number falls in past yr: 0  Injury with Fall? 0  Follow up Falls evaluation completed    Depression Screen Depression screen [facility] 2/9 07/04/2019 06/06/2019  Decreased Interest 0 0  Down, Depressed, Hopeless 0 0  PHQ - 2 Score 0 0  Altered sleeping - 0  Tired, decreased energy - 0  Change in appetite - 0  Feeling bad or failure about yourself  - 0  Trouble concentrating - 0  Moving slowly or fidgety/restless - 0  Suicidal thoughts - 0  PHQ-9 Score - 0    Advanced Directives <no information>  Past Medical History:  History reviewed. No pertinent past medical history.  Surgical History:  History reviewed. No pertinent surgical history.  Medications:  No current outpatient medications on file prior to visit.   No current facility-administered medications on file prior to visit.    Allergies:  No Known Allergies  Social History:  Social History   Socioeconomic History  . Marital status: Single    Spouse name: Not on file  . Number of children: Not on file  . Years of education: Not on file  . Highest education level: Not on file  Occupational History  . Not on file  Tobacco Use  . Smoking status: Current  Every Day Smoker    Packs/day: 0.25    Types: Cigarettes  . Smokeless tobacco: Never Used  Substance and Sexual Activity  . Alcohol use: Yes    Alcohol/week: 14.0 standard drinks    Types: 14 Cans of beer per week    Comment: two 32 oz a day  . Drug use: Yes    Types: Marijuana    Comment: daily  . Sexual activity: Yes  Other Topics Concern  . Not on file  Social History Narrative  . Not on file   Social Determinants of Health   Financial Resource Strain: Low Risk   . Difficulty of Paying Living Expenses: Not hard at all  Food Insecurity: No Food Insecurity  .  Worried About Charity fundraiser in the Last Year: Never true  . Ran Out of Food in the Last Year: Never true  Transportation Needs: No Transportation Needs  . Lack of Transportation (Medical): No  . Lack of Transportation (Non-Medical): No  Physical Activity: Inactive  . Days of Exercise per Week: 0 days  . Minutes of Exercise per Session: 0 min  Stress: No Stress Concern Present  . Feeling of Stress : Not at all  Social Connections:   . Frequency of Communication with Friends and Family:   . Frequency of Social Gatherings with Friends and Family:   . Attends Religious Services:   . Active Member of Clubs or Organizations:   . Attends Archivist Meetings:   Marland Kitchen Marital Status:   Intimate Partner Violence:   . Fear of Current or Ex-Partner:   . Emotionally Abused:   Marland Kitchen Physically Abused:   . Sexually Abused:    Social History   Tobacco Use  Smoking Status Current Every Day Smoker  . Packs/day: 0.25  . Types: Cigarettes  Smokeless Tobacco Never Used   Social History   Substance and Sexual Activity  Alcohol Use Yes  . Alcohol/week: 14.0 standard drinks  . Types: 14 Cans of beer per week   Comment: two 32 oz a day    Family History:  Family History  Problem Relation Age of Onset  . Diabetes Mother   . Hypertension Mother   . Diabetes Brother   . Diabetes Maternal Grandmother   . Hypertension Maternal Grandfather     Past medical history, surgical history, medications, allergies, family history and social history reviewed with patient today and changes made to appropriate areas of the chart.   Review of Systems - negative All other ROS negative except what is listed above and in the HPI.      Objective:    BP 124/85 (BP Location: Left Arm, Cuff Size: Normal)   Pulse 72   Temp 98.8 F (37.1 C) (Oral)   Ht 5\' 10"  (1.778 m)   Wt 146 lb 9.6 oz (66.5 kg)   SpO2 98%   BMI 21.03 kg/m   Wt Readings from Last 3 Encounters:  07/04/19 146 lb 9.6 oz (66.5  kg)  06/06/19 147 lb (66.7 kg)    Physical Exam Vitals and nursing note reviewed.  Constitutional:      General: He is awake. He is not in acute distress.    Appearance: He is well-developed and well-groomed. He is not ill-appearing.  HENT:     Head: Normocephalic and atraumatic.     Right Ear: Hearing, tympanic membrane, ear canal and external ear normal. No drainage.     Left Ear: Hearing, tympanic membrane, ear canal and  external ear normal. No drainage.     Nose: Nose normal.     Mouth/Throat:     Pharynx: Uvula midline.  Eyes:     General: Lids are normal.        Right eye: No discharge.        Left eye: No discharge.     Extraocular Movements: Extraocular movements intact.     Conjunctiva/sclera: Conjunctivae normal.     Pupils: Pupils are equal, round, and reactive to light.     Visual Fields: Right eye visual fields normal and left eye visual fields normal.  Neck:     Thyroid: No thyromegaly.     Vascular: No carotid bruit or JVD.     Trachea: Trachea normal.  Cardiovascular:     Rate and Rhythm: Normal rate and regular rhythm.     Heart sounds: Normal heart sounds, S1 normal and S2 normal. No murmur. No gallop.   Pulmonary:     Effort: Pulmonary effort is normal. No accessory muscle usage or respiratory distress.     Breath sounds: Normal breath sounds.  Abdominal:     General: Bowel sounds are normal.     Palpations: Abdomen is soft. There is no hepatomegaly or splenomegaly.     Tenderness: There is no abdominal tenderness.  Musculoskeletal:     Cervical back: Normal range of motion and neck supple.     Lumbar back: Spasms and tenderness present. No swelling or edema. Decreased range of motion. Negative right straight leg raise test and negative left straight leg raise test. No scoliosis.     Right lower leg: No edema.     Left lower leg: No edema.     Comments: No rashes noted to lower back. Decreased ROM with flexion/extension + normal rotation and side to side.   Lymphadenopathy:     Head:     Right side of head: No submental, submandibular, tonsillar, preauricular or posterior auricular adenopathy.     Left side of head: No submental, submandibular, tonsillar, preauricular or posterior auricular adenopathy.     Cervical: No cervical adenopathy.  Skin:    General: Skin is warm and dry.     Capillary Refill: Capillary refill takes less than 2 seconds.     Findings: No rash.  Neurological:     Mental Status: He is alert and oriented to person, place, and time.     Cranial Nerves: Cranial nerves are intact.     Gait: Gait is intact.     Deep Tendon Reflexes: Reflexes are normal and symmetric.     Reflex Scores:      Brachioradialis reflexes are 2+ on the right side and 2+ on the left side.      Patellar reflexes are 2+ on the right side and 2+ on the left side. Psychiatric:        Attention and Perception: Attention normal.        Mood and Affect: Mood normal.        Speech: Speech normal.        Behavior: Behavior normal. Behavior is cooperative.        Thought Content: Thought content normal.        Cognition and Memory: Cognition normal.        Judgment: Judgment normal.    No results found for this or any previous visit.    Assessment & Plan:   Problem List Items Addressed This Visit    None  Discussed aspirin prophylaxis for myocardial infarction prevention and decision was it was not indicated  LABORATORY TESTING:  Health maintenance labs ordered today as discussed above.   The natural history of prostate cancer and ongoing controversy regarding screening and potential treatment outcomes of prostate cancer has been discussed with the patient. The meaning of a false positive PSA and a false negative PSA has been discussed. He indicates understanding of the limitations of this screening test and wishes  to proceed with screening PSA testing.   IMMUNIZATIONS:   - Tdap: Tetanus vaccination status reviewed: refuses today. -  Influenza: Up to date - Pneumovax: Not applicable - Prevnar: Not applicable - Zostavax vaccine: Not applicable  SCREENING: - Colonoscopy: Not applicable  Discussed with patient purpose of the colonoscopy is to detect colon cancer at curable precancerous or early stages   - AAA Screening: Not applicable  -Hearing Test: Not applicable  -Spirometry: Not applicable   PATIENT COUNSELING:    Sexuality: Discussed sexually transmitted diseases, partner selection, use of condoms, avoidance of unintended pregnancy  and contraceptive alternatives.   Advised to avoid cigarette smoking.  I discussed with the patient that most people either abstain from alcohol or drink within safe limits (<=14/week and <=4 drinks/occasion for males, <=7/weeks and <= 3 drinks/occasion for females) and that the risk for alcohol disorders and other health effects rises proportionally with the number of drinks per week and how often a drinker exceeds daily limits.  Discussed cessation/primary prevention of drug use and availability of treatment for abuse.   Diet: Encouraged to adjust caloric intake to maintain  or achieve ideal body weight, to reduce intake of dietary saturated fat and total fat, to limit sodium intake by avoiding high sodium foods and not adding table salt, and to maintain adequate dietary potassium and calcium preferably from fresh fruits, vegetables, and low-fat dairy products.    stressed the importance of regular exercise  Injury prevention: Discussed safety belts, safety helmets, smoke detector, smoking near bedding or upholstery.   Dental health: Discussed importance of regular tooth brushing, flossing, and dental visits.   Follow up plan: NEXT PREVENTATIVE PHYSICAL DUE IN 1 YEAR. No follow-ups on file.

## 2019-07-05 LAB — COMPREHENSIVE METABOLIC PANEL
ALT: 13 IU/L (ref 0–44)
AST: 24 IU/L (ref 0–40)
Albumin/Globulin Ratio: 2.2 (ref 1.2–2.2)
Albumin: 4.7 g/dL (ref 4.0–5.0)
Alkaline Phosphatase: 84 IU/L (ref 39–117)
BUN/Creatinine Ratio: 10 (ref 9–20)
BUN: 10 mg/dL (ref 6–24)
Bilirubin Total: 0.4 mg/dL (ref 0.0–1.2)
CO2: 25 mmol/L (ref 20–29)
Calcium: 9.3 mg/dL (ref 8.7–10.2)
Chloride: 103 mmol/L (ref 96–106)
Creatinine, Ser: 1.02 mg/dL (ref 0.76–1.27)
GFR calc Af Amer: 104 mL/min/{1.73_m2} (ref >59)
GFR calc non Af Amer: 90 mL/min/{1.73_m2} (ref >59)
Globulin, Total: 2.1 g/dL (ref 1.5–4.5)
Glucose: 76 mg/dL (ref 65–99)
Potassium: 4.5 mmol/L (ref 3.5–5.2)
Sodium: 140 mmol/L (ref 134–144)
Total Protein: 6.8 g/dL (ref 6.0–8.5)

## 2019-07-05 LAB — CBC WITH DIFFERENTIAL/PLATELET
Basophils Absolute: 0 10*3/uL (ref 0.0–0.2)
Basos: 1 %
EOS (ABSOLUTE): 0.2 10*3/uL (ref 0.0–0.4)
Eos: 4 %
Hematocrit: 39.4 % (ref 37.5–51.0)
Hemoglobin: 13.5 g/dL (ref 13.0–17.7)
Immature Grans (Abs): 0 10*3/uL (ref 0.0–0.1)
Immature Granulocytes: 0 %
Lymphocytes Absolute: 2 10*3/uL (ref 0.7–3.1)
Lymphs: 55 %
MCH: 32.7 pg (ref 26.6–33.0)
MCHC: 34.3 g/dL (ref 31.5–35.7)
MCV: 95 fL (ref 79–97)
Monocytes Absolute: 0.3 10*3/uL (ref 0.1–0.9)
Monocytes: 9 %
Neutrophils Absolute: 1.1 10*3/uL — ABNORMAL LOW (ref 1.4–7.0)
Neutrophils: 31 %
Platelets: 283 10*3/uL (ref 150–450)
RBC: 4.13 x10E6/uL — ABNORMAL LOW (ref 4.14–5.80)
RDW: 13.1 % (ref 11.6–15.4)
WBC: 3.6 10*3/uL (ref 3.4–10.8)

## 2019-07-05 LAB — LIPID PANEL W/O CHOL/HDL RATIO
Cholesterol, Total: 177 mg/dL (ref 100–199)
HDL: 91 mg/dL (ref >39)
LDL Chol Calc (NIH): 78 mg/dL (ref 0–99)
Triglycerides: 36 mg/dL (ref 0–149)
VLDL Cholesterol Cal: 8 mg/dL (ref 5–40)

## 2019-07-05 LAB — PSA: Prostate Specific Ag, Serum: 1.1 ng/mL (ref 0.0–4.0)

## 2019-07-05 LAB — TSH: TSH: 0.461 u[IU]/mL (ref 0.450–4.500)

## 2019-07-05 NOTE — Progress Notes (Signed)
Contacted via [place] afternoon Treshawn.  Your labs have returned and overall they look great.  I have no concerns.  Kidney and liver function are good.  Thyroid and prostate testing normal.  Overall great!! Keep being awesome!! Kindest regards, Riana Tessmer

## 2020-07-09 ENCOUNTER — Encounter: Payer: Managed Care, Other (non HMO) | Admitting: Nurse Practitioner

## 2020-07-16 ENCOUNTER — Ambulatory Visit (INDEPENDENT_AMBULATORY_CARE_PROVIDER_SITE_OTHER): Payer: Managed Care, Other (non HMO) | Admitting: Nurse Practitioner

## 2020-07-16 ENCOUNTER — Encounter: Payer: Self-pay | Admitting: Nurse Practitioner

## 2020-07-16 ENCOUNTER — Other Ambulatory Visit: Payer: Self-pay

## 2020-07-16 VITALS — BP 134/86 | HR 72 | Temp 98.7°F | Ht 69.5 in | Wt 143.8 lb

## 2020-07-16 DIAGNOSIS — Z125 Encounter for screening for malignant neoplasm of prostate: Secondary | ICD-10-CM

## 2020-07-16 DIAGNOSIS — Z1159 Encounter for screening for other viral diseases: Secondary | ICD-10-CM

## 2020-07-16 DIAGNOSIS — R030 Elevated blood-pressure reading, without diagnosis of hypertension: Secondary | ICD-10-CM

## 2020-07-16 DIAGNOSIS — Z7289 Other problems related to lifestyle: Secondary | ICD-10-CM

## 2020-07-16 DIAGNOSIS — F17220 Nicotine dependence, chewing tobacco, uncomplicated: Secondary | ICD-10-CM

## 2020-07-16 DIAGNOSIS — Z114 Encounter for screening for human immunodeficiency virus [HIV]: Secondary | ICD-10-CM | POA: Diagnosis not present

## 2020-07-16 DIAGNOSIS — Z0000 Encounter for general adult medical examination without abnormal findings: Secondary | ICD-10-CM | POA: Diagnosis not present

## 2020-07-16 DIAGNOSIS — Z23 Encounter for immunization: Secondary | ICD-10-CM | POA: Diagnosis not present

## 2020-07-16 DIAGNOSIS — Z789 Other specified health status: Secondary | ICD-10-CM

## 2020-07-16 NOTE — Assessment & Plan Note (Signed)
BP at goal today on recheck.  No medications.  Continue DASH diet focus and monitoring at home.  CBC, CMP, and TSH today.

## 2020-07-16 NOTE — Patient Instructions (Signed)
Healthy Eating Following a healthy eating pattern may help you to achieve and maintain a healthy body weight, reduce the risk of chronic disease, and live a long and productive life. It is important to follow a healthy eating pattern at an appropriate calorie level for your body. Your nutritional needs should be met primarily through food by choosing a variety of nutrient-rich foods. What are tips for following this plan? Reading food labels  Read labels and choose the following: ? Reduced or low sodium. ? Juices with 100% fruit juice. ? Foods with low saturated fats and high polyunsaturated and monounsaturated fats. ? Foods with whole grains, such as whole wheat, cracked wheat, brown rice, and wild rice. ? Whole grains that are fortified with folic acid. This is recommended for women who are pregnant or who want to become pregnant.  Read labels and avoid the following: ? Foods with a lot of added sugars. These include foods that contain brown sugar, corn sweetener, corn syrup, dextrose, fructose, glucose, high-fructose corn syrup, honey, invert sugar, lactose, malt syrup, maltose, molasses, raw sugar, sucrose, trehalose, or turbinado sugar.  Do not eat more than the following amounts of added sugar per day:  6 teaspoons (25 g) for women.  9 teaspoons (38 g) for men. ? Foods that contain processed or refined starches and grains. ? Refined grain products, such as white flour, degermed cornmeal, white bread, and white rice. Shopping  Choose nutrient-rich snacks, such as vegetables, whole fruits, and nuts. Avoid high-calorie and high-sugar snacks, such as potato chips, fruit snacks, and candy.  Use oil-based dressings and spreads on foods instead of solid fats such as butter, stick margarine, or cream cheese.  Limit pre-made sauces, mixes, and "instant" products such as flavored rice, instant noodles, and ready-made pasta.  Try more plant-protein sources, such as tofu, tempeh, black beans,  edamame, lentils, nuts, and seeds.  Explore eating plans such as the Mediterranean diet or vegetarian diet. Cooking  Use oil to saut or stir-fry foods instead of solid fats such as butter, stick margarine, or lard.  Try baking, boiling, grilling, or broiling instead of frying.  Remove the fatty part of meats before cooking.  Steam vegetables in water or broth. Meal planning  At meals, imagine dividing your plate into fourths: ? One-half of your plate is fruits and vegetables. ? One-fourth of your plate is whole grains. ? One-fourth of your plate is protein, especially lean meats, poultry, eggs, tofu, beans, or nuts.  Include low-fat dairy as part of your daily diet.   Lifestyle  Choose healthy options in all settings, including home, work, school, restaurants, or stores.  Prepare your food safely: ? Wash your hands after handling raw meats. ? Keep food preparation surfaces clean by regularly washing with hot, soapy water. ? Keep raw meats separate from ready-to-eat foods, such as fruits and vegetables. ? Cook seafood, meat, poultry, and eggs to the recommended internal temperature. ? Store foods at safe temperatures. In general:  Keep cold foods at 7F (4.4C) or below.  Keep hot foods at 17F (60C) or above.  Keep your freezer at [facility] (-17.8C) or below.  Foods are no longer safe to eat when they have been between the temperatures of 40-17F (4.4-60C) for more than 2 hours. What foods should I eat? Fruits Aim to eat 2 cup-equivalents of fresh, canned (in natural juice), or frozen fruits each day. Examples of 1 cup-equivalent of fruit include 1 small apple, 8 large strawberries, 1 cup canned fruit,  cup dried fruit, or 1 cup 100% juice. Vegetables Aim to eat 2-3 cup-equivalents of fresh and frozen vegetables each day, including different varieties and colors. Examples of 1 cup-equivalent of vegetables include 2 medium carrots, 2 cups raw, leafy greens, 1 cup chopped  vegetable (raw or cooked), or 1 medium baked potato. Grains Aim to eat 6 ounce-equivalents of whole grains each day. Examples of 1 ounce-equivalent of grains include 1 slice of bread, 1 cup ready-to-eat cereal, 3 cups popcorn, or  cup cooked rice, pasta, or cereal. Meats and other proteins Aim to eat 5-6 ounce-equivalents of protein each day. Examples of 1 ounce-equivalent of protein include 1 egg, 1/2 cup nuts or seeds, or 1 tablespoon (16 g) peanut butter. A cut of meat or fish that is the size of a deck of cards is about 3-4 ounce-equivalents.  Of the protein you eat each week, try to have at least 8 ounces come from seafood. This includes salmon, trout, herring, and anchovies. Dairy Aim to eat 3 cup-equivalents of fat-free or low-fat dairy each day. Examples of 1 cup-equivalent of dairy include 1 cup (240 mL) milk, 8 ounces (250 g) yogurt, 1 ounces (44 g) natural cheese, or 1 cup (240 mL) fortified soy milk. Fats and oils  Aim for about 5 teaspoons (21 g) per day. Choose monounsaturated fats, such as canola and olive oils, avocados, peanut butter, and most nuts, or polyunsaturated fats, such as sunflower, corn, and soybean oils, walnuts, pine nuts, sesame seeds, sunflower seeds, and flaxseed. Beverages  Aim for six 8-oz glasses of water per day. Limit coffee to three to five 8-oz cups per day.  Limit caffeinated beverages that have added calories, such as soda and energy drinks.  Limit alcohol intake to no more than 1 drink a day for nonpregnant women and 2 drinks a day for men. One drink equals 12 oz of beer (355 mL), 5 oz of wine (148 mL), or 1 oz of hard liquor (44 mL). Seasoning and other foods  Avoid adding excess amounts of salt to your foods. Try flavoring foods with herbs and spices instead of salt.  Avoid adding sugar to foods.  Try using oil-based dressings, sauces, and spreads instead of solid fats. This information is based on general [place] nutrition guidelines. For more  information, visit [employer]. Exact amounts may vary based on your nutrition needs. Summary  A healthy eating plan may help you to maintain a healthy weight, reduce the risk of chronic diseases, and stay active throughout your life.  Plan your meals. Make sure you eat the right portions of a variety of nutrient-rich foods.  Try baking, boiling, grilling, or broiling instead of frying.  Choose healthy options in all settings, including home, work, school, restaurants, or stores. This information is not intended to replace advice given to you by your health care provider. Make sure you discuss any questions you have with your health care provider. Document Revised: 05/28/2017 Document Reviewed: 05/28/2017 Elsevier Patient Education  [employer].

## 2020-07-16 NOTE — Assessment & Plan Note (Signed)
Drinks two 32 oz beer a day, recommend cutting back on this which would be beneficial to BP and overall health.  Will obtain CMP today. 

## 2020-07-16 NOTE — Assessment & Plan Note (Signed)
I have recommended complete cessation of tobacco use. I have discussed various options available for assistance with tobacco cessation including over the counter methods (Nicotine gum, patch and lozenges). We also discussed prescription options (Chantix, Nicotine Inhaler / Nasal Spray). The patient is not interested in pursuing any prescription tobacco cessation options at this time.  

## 2020-07-16 NOTE — Progress Notes (Signed)
BP 134/86 (BP Location: Left Arm)   Pulse 72   Temp 98.7 F (37.1 C) (Oral)   Ht 5' 9.5" (1.765 m)   Wt 143 lb 12.8 oz (65.2 kg)   SpO2 97%   BMI 20.93 kg/m    Subjective:    Patient ID: Donald Oconnor, male    DOB: [birth year], 45 y.o.   MRN: [id number]  HPI: Luster JOSECRUZ BETZER is a 45 y.o. male presenting on 07/16/2020 for comprehensive medical examination. Current medical complaints include:none  He currently lives with: none Interim Problems from his last visit: no   Continues to smoke, 4 cigarettes a day, is trying to quit on own and not interested in medications -- he is thinking of quitting on [birth year], [birth year], cold [place].  Drinks two 36 oz beer a day.  [facility] Visit from 07/16/2020 in [facility]  Alcohol Use Disorder Identification Test Final Score (AUDIT) 8     Functional Status Survey: Is the patient deaf or have difficulty hearing?: No Does the patient have difficulty seeing, even when wearing glasses/contacts?: No Does the patient have difficulty concentrating, remembering, or making decisions?: No Does the patient have difficulty walking or climbing stairs?: No Does the patient have difficulty dressing or bathing?: No Does the patient have difficulty doing errands alone such as visiting a doctor's office or shopping?: No  FALL RISK: Fall Risk  07/04/2019  Falls in the past year? 0  Number falls in past yr: 0  Injury with Fall? 0  Follow up Falls evaluation completed    Depression Screen Depression screen [facility] 2/9 07/16/2020 07/04/2019 06/06/2019  Decreased Interest 0 0 0  Down, Depressed, Hopeless 0 0 0  PHQ - 2 Score 0 0 0  Altered sleeping - - 0  Tired, decreased energy - - 0  Change in appetite - - 0  Feeling bad or failure about yourself  - - 0  Trouble concentrating - - 0  Moving slowly or fidgety/restless - - 0  Suicidal thoughts - - 0  PHQ-9 Score - - 0    Advanced Directives <no information>  Past Medical  History:  History reviewed. No pertinent past medical history.  Surgical History:  History reviewed. No pertinent surgical history.  Medications:  Current Outpatient Medications on File Prior to Visit  Medication Sig  . methocarbamol (ROBAXIN) 500 MG tablet Take 1 tablet (500 mg total) by mouth every 8 (eight) hours as needed for muscle spasms. (Patient not taking: Reported on 07/16/2020)   No current facility-administered medications on file prior to visit.    Allergies:  No Known Allergies  Social History:  Social History   Socioeconomic History  . Marital status: Single    Spouse name: Not on file  . Number of children: Not on file  . Years of education: Not on file  . Highest education level: Not on file  Occupational History  . Not on file  Tobacco Use  . Smoking status: Current Every Day Smoker    Packs/day: 0.25    Types: Cigarettes  . Smokeless tobacco: Never Used  Vaping Use  . Vaping Use: Never used  Substance and Sexual Activity  . Alcohol use: Yes    Alcohol/week: 14.0 standard drinks    Types: 14 Cans of beer per week    Comment: two 32 oz a day  . Drug use: Yes    Types: Marijuana    Comment: daily  . Sexual activity: Yes  Other Topics Concern  . Not on file  Social History Narrative  . Not on file   Social Determinants of Health   Financial Resource Strain: Low Risk   . Difficulty of Paying Living Expenses: Not hard at all  Food Insecurity: No Food Insecurity  . Worried About Charity fundraiser in the Last Year: Never true  . Ran Out of Food in the Last Year: Never true  Transportation Needs: No Transportation Needs  . Lack of Transportation (Medical): No  . Lack of Transportation (Non-Medical): No  Physical Activity: Inactive  . Days of Exercise per Week: 0 days  . Minutes of Exercise per Session: 0 min  Stress: No Stress Concern Present  . Feeling of Stress : Only a little  Social Connections: Socially Isolated  . Frequency of  Communication with Friends and Family: More than three times a week  . Frequency of Social Gatherings with Friends and Family: More than three times a week  . Attends Religious Services: Never  . Active Member of Clubs or Organizations: No  . Attends Archivist Meetings: Never  . Marital Status: Never married  Intimate Partner Violence: Not on file   Social History   Tobacco Use  Smoking Status Current Every Day Smoker  . Packs/day: 0.25  . Types: Cigarettes  Smokeless Tobacco Never Used   Social History   Substance and Sexual Activity  Alcohol Use Yes  . Alcohol/week: 14.0 standard drinks  . Types: 14 Cans of beer per week   Comment: two 32 oz a day    Family History:  Family History  Problem Relation Age of Onset  . Diabetes Mother   . Hypertension Mother   . Diabetes Brother   . Diabetes Maternal Grandmother   . Hypertension Maternal Grandfather     Past medical history, surgical history, medications, allergies, family history and social history reviewed with patient today and changes made to appropriate areas of the chart.   Review of Systems - negative All other ROS negative except what is listed above and in the HPI.      Objective:    BP 134/86 (BP Location: Left Arm)   Pulse 72   Temp 98.7 F (37.1 C) (Oral)   Ht 5' 9.5" (1.765 m)   Wt 143 lb 12.8 oz (65.2 kg)   SpO2 97%   BMI 20.93 kg/m   Wt Readings from Last 3 Encounters:  07/16/20 143 lb 12.8 oz (65.2 kg)  07/04/19 146 lb 9.6 oz (66.5 kg)  06/06/19 147 lb (66.7 kg)    Physical Exam Vitals and nursing note reviewed.  Constitutional:      General: He is awake. He is not in acute distress.    Appearance: He is well-developed and well-groomed. He is not ill-appearing.  HENT:     Head: Normocephalic and atraumatic.     Right Ear: Hearing, tympanic membrane, ear canal and external ear normal. No drainage.     Left Ear: Hearing, tympanic membrane, ear canal and external ear normal. No  drainage.     Nose: Nose normal.     Mouth/Throat:     Pharynx: Uvula midline.  Eyes:     General: Lids are normal.        Right eye: No discharge.        Left eye: No discharge.     Extraocular Movements: Extraocular movements intact.     Conjunctiva/sclera: Conjunctivae normal.     Pupils: Pupils are equal,  round, and reactive to light.     Visual Fields: Right eye visual fields normal and left eye visual fields normal.  Neck:     Thyroid: No thyromegaly.     Vascular: No carotid bruit or JVD.     Trachea: Trachea normal.  Cardiovascular:     Rate and Rhythm: Normal rate and regular rhythm.     Heart sounds: Normal heart sounds, S1 normal and S2 normal. No murmur heard. No gallop.   Pulmonary:     Effort: Pulmonary effort is normal. No accessory muscle usage or respiratory distress.     Breath sounds: Normal breath sounds.  Abdominal:     General: Bowel sounds are normal.     Palpations: Abdomen is soft. There is no hepatomegaly or splenomegaly.     Tenderness: There is no abdominal tenderness.  Musculoskeletal:     Cervical back: Normal range of motion and neck supple.     Right lower leg: No edema.     Left lower leg: No edema.  Lymphadenopathy:     Head:     Right side of head: No submental, submandibular, tonsillar, preauricular or posterior auricular adenopathy.     Left side of head: No submental, submandibular, tonsillar, preauricular or posterior auricular adenopathy.     Cervical: No cervical adenopathy.  Skin:    General: Skin is warm and dry.     Capillary Refill: Capillary refill takes less than 2 seconds.     Findings: No rash.  Neurological:     Mental Status: He is alert and oriented to person, place, and time.     Cranial Nerves: Cranial nerves are intact.     Gait: Gait is intact.     Deep Tendon Reflexes: Reflexes are normal and symmetric.     Reflex Scores:      Brachioradialis reflexes are 2+ on the right side and 2+ on the left side.      Patellar  reflexes are 2+ on the right side and 2+ on the left side. Psychiatric:        Attention and Perception: Attention normal.        Mood and Affect: Mood normal.        Speech: Speech normal.        Behavior: Behavior normal. Behavior is cooperative.        Thought Content: Thought content normal.        Cognition and Memory: Cognition normal.        Judgment: Judgment normal.    Results for orders placed or performed in visit on 07/04/19  CBC with Differential/Platelet  Result Value Ref Range   WBC 3.6 3.4 - 10.8 x10E3/uL   RBC 4.13 (L) 4.14 - 5.80 x10E6/uL   Hemoglobin 13.5 13.0 - 17.7 g/dL   Hematocrit 25.3 66.4 - 51.0 %   MCV 95 79 - 97 fL   MCH 32.7 26.6 - 33.0 pg   MCHC 34.3 31.5 - 35.7 g/dL   RDW 40.3 47.4 - 25.9 %   Platelets 283 150 - 450 x10E3/uL   Neutrophils 31 Not Estab. %   Lymphs 55 Not Estab. %   Monocytes 9 Not Estab. %   Eos 4 Not Estab. %   Basos 1 Not Estab. %   Neutrophils Absolute 1.1 (L) 1.4 - 7.0 x10E3/uL   Lymphocytes Absolute 2.0 0.7 - 3.1 x10E3/uL   Monocytes Absolute 0.3 0.1 - 0.9 x10E3/uL   EOS (ABSOLUTE) 0.2 0.0 - 0.4 x10E3/uL  Basophils Absolute 0.0 0.0 - 0.2 x10E3/uL   Immature Granulocytes 0 Not Estab. %   Immature Grans (Abs) 0.0 0.0 - 0.1 x10E3/uL  Comprehensive metabolic panel  Result Value Ref Range   Glucose 76 65 - 99 mg/dL   BUN 10 6 - 24 mg/dL   Creatinine, Ser 1.02 0.76 - 1.27 mg/dL   GFR calc non Af Amer 90 >59 mL/min/1.73   GFR calc Af Amer 104 >59 mL/min/1.73   BUN/Creatinine Ratio 10 9 - 20   Sodium 140 134 - 144 mmol/L   Potassium 4.5 3.5 - 5.2 mmol/L   Chloride 103 96 - 106 mmol/L   CO2 25 20 - 29 mmol/L   Calcium 9.3 8.7 - 10.2 mg/dL   Total Protein 6.8 6.0 - 8.5 g/dL   Albumin 4.7 4.0 - 5.0 g/dL   Globulin, Total 2.1 1.5 - 4.5 g/dL   Albumin/Globulin Ratio 2.2 1.2 - 2.2   Bilirubin Total 0.4 0.0 - 1.2 mg/dL   Alkaline Phosphatase 84 39 - 117 IU/L   AST 24 0 - 40 IU/L   ALT 13 0 - 44 IU/L  Lipid Panel w/o Chol/HDL  Ratio  Result Value Ref Range   Cholesterol, Total 177 100 - 199 mg/dL   Triglycerides 36 0 - 149 mg/dL   HDL 91 >39 mg/dL   VLDL Cholesterol Cal 8 5 - 40 mg/dL   LDL Chol Calc (NIH) 78 0 - 99 mg/dL  TSH  Result Value Ref Range   TSH 0.461 0.450 - 4.500 uIU/mL  PSA  Result Value Ref Range   Prostate Specific Ag, Serum 1.1 0.0 - 4.0 ng/mL      Assessment & Plan:   Problem List Items Addressed This Visit      Other   Elevated BP without diagnosis of hypertension - Primary    BP at goal today on recheck.  No medications.  Continue DASH diet focus and monitoring at home.  CBC, CMP, and TSH today.      Relevant Orders   Comprehensive metabolic panel   CBC with Differential/Platelet   Lipid Panel w/o Chol/HDL Ratio   TSH   Nicotine dependence, chewing tobacco, uncomplicated    I have recommended complete cessation of tobacco use. I have discussed various options available for assistance with tobacco cessation including over the counter methods (Nicotine gum, patch and lozenges). We also discussed prescription options (Chantix, Nicotine Inhaler / Nasal Spray). The patient is not interested in pursuing any prescription tobacco cessation options at this time.       Alcohol use    Drinks two 32 oz beer a day, recommend cutting back on this which would be beneficial to BP and overall health.  Will obtain CMP today.       Other Visit Diagnoses    Encounter for screening for HIV       HIV screening x one time guideline recommendations, will obtain on labs today, discussed with patient.   Relevant Orders   HIV Antibody (routine testing w rflx)   Need for hepatitis C screening test       Hep C screening x one time guideline recommendations, will obtain on labs today, discussed with patient.   Relevant Orders   Hepatitis C antibody   Prostate cancer screening       PSA on labs today per request   Relevant Orders   PSA   Annual physical exam       Annual labs today and health  maintenance reviewed.  Agrees to Pneumococcal vaccine next visit.   Relevant Orders   PSA      Discussed aspirin prophylaxis for myocardial infarction prevention and decision was it was not indicated  LABORATORY TESTING:  Health maintenance labs ordered today as discussed above.   The natural history of prostate cancer and ongoing controversy regarding screening and potential treatment outcomes of prostate cancer has been discussed with the patient. The meaning of a false positive PSA and a false negative PSA has been discussed. He indicates understanding of the limitations of this screening test and wishes  to proceed with screening PSA testing.   IMMUNIZATIONS:   - Tdap: Tetanus vaccination status reviewed: today provided - Influenza: Up to date - Pneumovax: Not applicable - Prevnar: Not applicable - Zostavax vaccine: Not applicable  SCREENING: - Colonoscopy: Not applicable  Discussed with patient purpose of the colonoscopy is to detect colon cancer at curable precancerous or early stages   - AAA Screening: Not applicable  -Hearing Test: Not applicable  -Spirometry: Not applicable   PATIENT COUNSELING:    Sexuality: Discussed sexually transmitted diseases, partner selection, use of condoms, avoidance of unintended pregnancy  and contraceptive alternatives.   Advised to avoid cigarette smoking.  I discussed with the patient that most people either abstain from alcohol or drink within safe limits (<=14/week and <=4 drinks/occasion for males, <=7/weeks and <= 3 drinks/occasion for females) and that the risk for alcohol disorders and other health effects rises proportionally with the number of drinks per week and how often a drinker exceeds daily limits.  Discussed cessation/primary prevention of drug use and availability of treatment for abuse.   Diet: Encouraged to adjust caloric intake to maintain  or achieve ideal body weight, to reduce intake of dietary saturated fat and  total fat, to limit sodium intake by avoiding high sodium foods and not adding table salt, and to maintain adequate dietary potassium and calcium preferably from fresh fruits, vegetables, and low-fat dairy products.    stressed the importance of regular exercise  Injury prevention: Discussed safety belts, safety helmets, smoke detector, smoking near bedding or upholstery.   Dental health: Discussed importance of regular tooth brushing, flossing, and dental visits.   Follow up plan: NEXT PREVENTATIVE PHYSICAL DUE IN 1 YEAR. Return in about 1 year (around 07/16/2021) for Annual physical.

## 2020-07-17 LAB — CBC WITH DIFFERENTIAL/PLATELET
Basophils Absolute: 0.1 10*3/uL (ref 0.0–0.2)
Basos: 1 %
EOS (ABSOLUTE): 0.2 10*3/uL (ref 0.0–0.4)
Eos: 4 %
Hematocrit: 39.6 % (ref 37.5–51.0)
Hemoglobin: 13.8 g/dL (ref 13.0–17.7)
Immature Grans (Abs): 0 10*3/uL (ref 0.0–0.1)
Immature Granulocytes: 0 %
Lymphocytes Absolute: 2.8 10*3/uL (ref 0.7–3.1)
Lymphs: 53 %
MCH: 32.7 pg (ref 26.6–33.0)
MCHC: 34.8 g/dL (ref 31.5–35.7)
MCV: 94 fL (ref 79–97)
Monocytes Absolute: 0.4 10*3/uL (ref 0.1–0.9)
Monocytes: 7 %
Neutrophils Absolute: 1.9 10*3/uL (ref 1.4–7.0)
Neutrophils: 35 %
Platelets: 357 10*3/uL (ref 150–450)
RBC: 4.22 x10E6/uL (ref 4.14–5.80)
RDW: 13 % (ref 11.6–15.4)
WBC: 5.3 10*3/uL (ref 3.4–10.8)

## 2020-07-17 LAB — COMPREHENSIVE METABOLIC PANEL
ALT: 17 IU/L (ref 0–44)
AST: 26 IU/L (ref 0–40)
Albumin/Globulin Ratio: 2.4 — ABNORMAL HIGH (ref 1.2–2.2)
Albumin: 5 g/dL (ref 4.0–5.0)
Alkaline Phosphatase: 78 IU/L (ref 44–121)
BUN/Creatinine Ratio: 17 (ref 9–20)
BUN: 17 mg/dL (ref 6–24)
Bilirubin Total: 0.2 mg/dL (ref 0.0–1.2)
CO2: 24 mmol/L (ref 20–29)
Calcium: 9.7 mg/dL (ref 8.7–10.2)
Chloride: 101 mmol/L (ref 96–106)
Creatinine, Ser: 0.98 mg/dL (ref 0.76–1.27)
Globulin, Total: 2.1 g/dL (ref 1.5–4.5)
Glucose: 79 mg/dL (ref 65–99)
Potassium: 4.1 mmol/L (ref 3.5–5.2)
Sodium: 143 mmol/L (ref 134–144)
Total Protein: 7.1 g/dL (ref 6.0–8.5)
eGFR: 98 mL/min/{1.73_m2} (ref >59)

## 2020-07-17 LAB — LIPID PANEL W/O CHOL/HDL RATIO
Cholesterol, Total: 170 mg/dL (ref 100–199)
HDL: 76 mg/dL (ref >39)
LDL Chol Calc (NIH): 80 mg/dL (ref 0–99)
Triglycerides: 72 mg/dL (ref 0–149)
VLDL Cholesterol Cal: 14 mg/dL (ref 5–40)

## 2020-07-17 LAB — HIV ANTIBODY (ROUTINE TESTING W REFLEX): HIV Screen 4th Generation wRfx: NONREACTIVE

## 2020-07-17 LAB — TSH: TSH: 0.856 u[IU]/mL (ref 0.450–4.500)

## 2020-07-17 LAB — PSA: Prostate Specific Ag, Serum: 1.3 ng/mL (ref 0.0–4.0)

## 2020-07-17 LAB — HEPATITIS C ANTIBODY: Hep C Virus Ab: <0.1 s/co ratio (ref 0.0–0.9)

## 2020-07-17 NOTE — Progress Notes (Signed)
Contacted via MyChart   Good afternoon Donald Oconnor, your labs have returned.  Overall everything looks nice and normal -- great job!!   Keep being awesome!!  Thank you for allowing me to participate in your care.  I appreciate you. Kindest regards, Cyrilla Durkin

## 2021-07-17 NOTE — Patient Instructions (Incomplete)

## 2021-07-19 ENCOUNTER — Encounter: Payer: Self-pay | Admitting: Nurse Practitioner

## 2021-08-21 NOTE — Patient Instructions (Signed)

## 2021-08-24 ENCOUNTER — Ambulatory Visit (INDEPENDENT_AMBULATORY_CARE_PROVIDER_SITE_OTHER): Payer: BC Managed Care – PPO | Admitting: Nurse Practitioner

## 2021-08-24 ENCOUNTER — Telehealth: Payer: Self-pay

## 2021-08-24 ENCOUNTER — Encounter: Payer: Self-pay | Admitting: Nurse Practitioner

## 2021-08-24 VITALS — BP 132/84 | HR 81 | Temp 98.0°F | Ht 69.75 in | Wt 144.6 lb

## 2021-08-24 DIAGNOSIS — Z0000 Encounter for general adult medical examination without abnormal findings: Secondary | ICD-10-CM

## 2021-08-24 DIAGNOSIS — F17220 Nicotine dependence, chewing tobacco, uncomplicated: Secondary | ICD-10-CM

## 2021-08-24 DIAGNOSIS — Z789 Other specified health status: Secondary | ICD-10-CM | POA: Diagnosis not present

## 2021-08-24 DIAGNOSIS — Z1211 Encounter for screening for malignant neoplasm of colon: Secondary | ICD-10-CM

## 2021-08-24 DIAGNOSIS — Z136 Encounter for screening for cardiovascular disorders: Secondary | ICD-10-CM | POA: Diagnosis not present

## 2021-08-24 DIAGNOSIS — R030 Elevated blood-pressure reading, without diagnosis of hypertension: Secondary | ICD-10-CM

## 2021-08-24 NOTE — Assessment & Plan Note (Signed)
Drinks three 16 oz beer a day, recommend cutting back on this which would be beneficial to BP and overall health.  Will obtain CMP and CBC today.

## 2021-08-24 NOTE — Assessment & Plan Note (Signed)
I have recommended complete cessation of tobacco use. I have discussed various options available for assistance with tobacco cessation including over the counter methods (Nicotine gum, patch and lozenges). We also discussed prescription options (Chantix, Nicotine Inhaler / Nasal Spray). The patient is not interested in pursuing any prescription tobacco cessation options at this time.  

## 2021-08-24 NOTE — Progress Notes (Signed)
BP 132/84 (BP Location: Left Arm, Patient Position: Sitting, Cuff Size: Normal)   Pulse 81   Temp 98 F (36.7 C) (Oral)   Ht 5' 9.75" (1.772 m)   Wt 144 lb 9.6 oz (65.6 kg)   SpO2 99%   BMI 20.90 kg/m    Subjective:    Patient ID: Donald Oconnor, male    DOB: 10/30/1975, 46 y.o.   MRN: [id number]  HPI: Donald Oconnor is a 46 y.o. male presenting on 08/24/2021 for comprehensive medical examination. Current medical complaints include:none  He currently lives with: self Interim Problems from his last visit: no  Continues to smoke, smoking about 1 pack every 2 days.  Has been smoking since age 41.  Continues to drink 3 16 oz beer a day.    Functional Status Survey: Is the patient deaf or have difficulty hearing?: No Does the patient have difficulty seeing, even when wearing glasses/contacts?: No Does the patient have difficulty concentrating, remembering, or making decisions?: No Does the patient have difficulty walking or climbing stairs?: No Does the patient have difficulty dressing or bathing?: No Does the patient have difficulty doing errands alone such as visiting a doctor's office or shopping?: No  FALL RISK:    08/24/2021   10:11 AM 07/04/2019   10:03 AM  [place] in the past year? 0 0  Number falls in past yr: 0 0  Injury with Fall? 0 0  Risk for fall due to : No Fall Risks   Follow up Falls evaluation completed Falls evaluation completed    Depression Screen    08/24/2021   10:10 AM 07/16/2020    3:01 PM 07/04/2019   10:04 AM 06/06/2019   10:57 AM  Depression screen PHQ 2/9  Decreased Interest 0 0 0 0  Down, Depressed, Hopeless 0 0 0 0  PHQ - 2 Score 0 0 0 0  Altered sleeping 0   0  Tired, decreased energy 1   0  Change in appetite 0   0  Feeling bad or failure about yourself  0   0  Trouble concentrating 0   0  Moving slowly or fidgety/restless 0   0  Suicidal thoughts 0   0  PHQ-9 Score 1   0  Difficult doing work/chores Not difficult at all        Advanced Directives <no information>  Past Medical History:  History reviewed. No pertinent past medical history.  Surgical History:  History reviewed. No pertinent surgical history.  Medications:  No current outpatient medications on file prior to visit.   No current facility-administered medications on file prior to visit.    Allergies:  No Known Allergies  Social History:  Social History   Socioeconomic History   Marital status: Single    Spouse name: Not on file   Number of children: Not on file   Years of education: Not on file   Highest education level: Not on file  Occupational History   Not on file  Tobacco Use   Smoking status: Every Day    Packs/day: 0.25    Types: Cigarettes   Smokeless tobacco: Never  Vaping Use   Vaping Use: Never used  Substance and Sexual Activity   Alcohol use: Yes    Alcohol/week: 14.0 standard drinks of alcohol    Types: 14 Cans of beer per week    Comment: two 32 oz a day   Drug use: Yes  Types: Marijuana    Comment: daily   Sexual activity: Yes  Other Topics Concern   Not on file  Social History Narrative   Not on file   Social Determinants of Health   Financial Resource Strain: Low Risk  (07/16/2020)   Overall Financial Resource Strain (CARDIA)    Difficulty of Paying Living Expenses: Not hard at all  Food Insecurity: No Food Insecurity (07/16/2020)   Hunger Vital Sign    Worried About Running Out of Food in the Last Year: Never true    [place] in the Last Year: Never true  Transportation Needs: No Transportation Needs (07/16/2020)   PRAPARE - Hydrologist (Medical): No    Lack of Transportation (Non-Medical): No  Physical Activity: Inactive (07/16/2020)   Exercise Vital Sign    Days of Exercise per Week: 0 days    Minutes of Exercise per Session: 0 min  Stress: No Stress Concern Present (07/16/2020)   [place]    Feeling of Stress : Only a little  Social Connections: Socially Isolated (07/16/2020)   Social Connection and Isolation Panel [NHANES]    Frequency of Communication with Friends and Family: More than three times a week    Frequency of Social Gatherings with Friends and Family: More than three times a week    Attends Religious Services: Never    Marine scientist or Organizations: No    Attends Music therapist: Never    Marital Status: Never married  Human resources officer Violence: Not on file   Social History   Tobacco Use  Smoking Status Every Day   Packs/day: 0.25   Types: Cigarettes  Smokeless Tobacco Never   Social History   Substance and Sexual Activity  Alcohol Use Yes   Alcohol/week: 14.0 standard drinks of alcohol   Types: 14 Cans of beer per week   Comment: two 32 oz a day   Family History:  Family History  Problem Relation Age of Onset   Diabetes Mother    Hypertension Mother    Diabetes Brother    Diabetes Maternal Grandmother    Hypertension Maternal Grandfather     Past medical history, surgical history, medications, allergies, family history and social history reviewed with patient today and changes made to appropriate areas of the chart.   ROS All other ROS negative except what is listed above and in the HPI.      Objective:    BP 132/84 (BP Location: Left Arm, Patient Position: Sitting, Cuff Size: Normal)   Pulse 81   Temp 98 F (36.7 C) (Oral)   Ht 5' 9.75" (1.772 m)   Wt 144 lb 9.6 oz (65.6 kg)   SpO2 99%   BMI 20.90 kg/m   Wt Readings from Last 3 Encounters:  08/24/21 144 lb 9.6 oz (65.6 kg)  07/16/20 143 lb 12.8 oz (65.2 kg)  07/04/19 146 lb 9.6 oz (66.5 kg)   Physical Exam Vitals and nursing note reviewed.  Constitutional:      General: He is awake. He is not in acute distress.    Appearance: He is well-developed and well-groomed. He is not ill-appearing or toxic-appearing.  HENT:     Head:  Normocephalic and atraumatic.     Right Ear: Hearing, tympanic membrane, ear canal and external ear normal. No drainage.     Left Ear: Hearing, tympanic membrane, ear canal and external ear normal.  No drainage.     Nose: Nose normal.     Mouth/Throat:     Pharynx: Uvula midline.  Eyes:     General: Lids are normal.        Right eye: No discharge.        Left eye: No discharge.     Extraocular Movements: Extraocular movements intact.     Conjunctiva/sclera: Conjunctivae normal.     Pupils: Pupils are equal, round, and reactive to light.     Visual Fields: Right eye visual fields normal and left eye visual fields normal.  Neck:     Thyroid: No thyromegaly.     Vascular: No carotid bruit or JVD.     Trachea: Trachea normal.  Cardiovascular:     Rate and Rhythm: Normal rate and regular rhythm.     Heart sounds: Normal heart sounds, S1 normal and S2 normal. No murmur heard.    No gallop.  Pulmonary:     Effort: Pulmonary effort is normal. No accessory muscle usage or respiratory distress.     Breath sounds: Normal breath sounds.  Abdominal:     General: Bowel sounds are normal.     Palpations: Abdomen is soft. There is no hepatomegaly or splenomegaly.     Tenderness: There is no abdominal tenderness.  Musculoskeletal:        General: Normal range of motion.     Cervical back: Normal range of motion and neck supple.     Right lower leg: No edema.     Left lower leg: No edema.  Lymphadenopathy:     Head:     Right side of head: No submental, submandibular, tonsillar, preauricular or posterior auricular adenopathy.     Left side of head: No submental, submandibular, tonsillar, preauricular or posterior auricular adenopathy.     Cervical: No cervical adenopathy.  Skin:    General: Skin is warm and dry.     Capillary Refill: Capillary refill takes less than 2 seconds.     Findings: No rash.  Neurological:     Mental Status: He is alert and oriented to person, place, and time.      Gait: Gait is intact.     Deep Tendon Reflexes: Reflexes are normal and symmetric.     Reflex Scores:      Brachioradialis reflexes are 2+ on the right side and 2+ on the left side.      Patellar reflexes are 2+ on the right side and 2+ on the left side. Psychiatric:        Attention and Perception: Attention normal.        Mood and Affect: Mood normal.        Speech: Speech normal.        Behavior: Behavior normal. Behavior is cooperative.        Thought Content: Thought content normal.        Cognition and Memory: Cognition normal.      Results for orders placed or performed in visit on 07/16/20  Comprehensive metabolic panel  Result Value Ref Range   Glucose 79 65 - 99 mg/dL   BUN 17 6 - 24 mg/dL   Creatinine, Ser 0.98 0.76 - 1.27 mg/dL   eGFR 98 >59 mL/min/1.73   BUN/Creatinine Ratio 17 9 - 20   Sodium 143 134 - 144 mmol/L   Potassium 4.1 3.5 - 5.2 mmol/L   Chloride 101 96 - 106 mmol/L   CO2 24 20 - 29 mmol/L  Calcium 9.7 8.7 - 10.2 mg/dL   Total Protein 7.1 6.0 - 8.5 g/dL   Albumin 5.0 4.0 - 5.0 g/dL   Globulin, Total 2.1 1.5 - 4.5 g/dL   Albumin/Globulin Ratio 2.4 (H) 1.2 - 2.2   Bilirubin Total 0.2 0.0 - 1.2 mg/dL   Alkaline Phosphatase 78 44 - 121 IU/L   AST 26 0 - 40 IU/L   ALT 17 0 - 44 IU/L  CBC with Differential/Platelet  Result Value Ref Range   WBC 5.3 3.4 - 10.8 x10E3/uL   RBC 4.22 4.14 - 5.80 x10E6/uL   Hemoglobin 13.8 13.0 - 17.7 g/dL   Hematocrit 39.6 37.5 - 51.0 %   MCV 94 79 - 97 fL   MCH 32.7 26.6 - 33.0 pg   MCHC 34.8 31.5 - 35.7 g/dL   RDW 13.0 11.6 - 15.4 %   Platelets 357 150 - 450 x10E3/uL   Neutrophils 35 Not Estab. %   Lymphs 53 Not Estab. %   Monocytes 7 Not Estab. %   Eos 4 Not Estab. %   Basos 1 Not Estab. %   Neutrophils Absolute 1.9 1.4 - 7.0 x10E3/uL   Lymphocytes Absolute 2.8 0.7 - 3.1 x10E3/uL   Monocytes Absolute 0.4 0.1 - 0.9 x10E3/uL   EOS (ABSOLUTE) 0.2 0.0 - 0.4 x10E3/uL   Basophils Absolute 0.1 0.0 - 0.2 x10E3/uL    Immature Granulocytes 0 Not Estab. %   Immature Grans (Abs) 0.0 0.0 - 0.1 x10E3/uL  Lipid Panel w/o Chol/HDL Ratio  Result Value Ref Range   Cholesterol, Total 170 100 - 199 mg/dL   Triglycerides 72 0 - 149 mg/dL   HDL 76 >39 mg/dL   VLDL Cholesterol Cal 14 5 - 40 mg/dL   LDL Chol Calc (NIH) 80 0 - 99 mg/dL  TSH  Result Value Ref Range   TSH 0.856 0.450 - 4.500 uIU/mL  Hepatitis C antibody  Result Value Ref Range   Hep C Virus Ab <0.1 0.0 - 0.9 s/co ratio  HIV Antibody (routine testing w rflx)  Result Value Ref Range   HIV Screen 4th Generation wRfx Non Reactive Non Reactive  PSA  Result Value Ref Range   Prostate Specific Ag, Serum 1.3 0.0 - 4.0 ng/mL      Assessment & Plan:   Problem List Items Addressed This Visit       Other   Alcohol use    Drinks three 16 oz beer a day, recommend cutting back on this which would be beneficial to BP and overall health.  Will obtain CMP and CBC today.      Relevant Orders   Comprehensive metabolic panel   Elevated BP without diagnosis of hypertension - Primary    BP at goal today on recheck.  No medications.  Continue DASH diet focus and monitoring at home.  CBC, CMP, and TSH today.      Relevant Orders   CBC with Differential/Platelet   Comprehensive metabolic panel   Lipid Panel w/o Chol/HDL Ratio   TSH   Nicotine dependence, chewing tobacco, uncomplicated    I have recommended complete cessation of tobacco use. I have discussed various options available for assistance with tobacco cessation including over the counter methods (Nicotine gum, patch and lozenges). We also discussed prescription options (Chantix, Nicotine Inhaler / Nasal Spray). The patient is not interested in pursuing any prescription tobacco cessation options at this time.       Other Visit Diagnoses     Colon  cancer screening       GI referral.   Relevant Orders   Ambulatory referral to Gastroenterology   Encounter for annual physical exam       Annual  physical today with labs and health maintenance reviewed with patient.        Discussed aspirin prophylaxis for myocardial infarction prevention and decision was it was not indicated  LABORATORY TESTING:  Health maintenance labs ordered today as discussed above.   The natural history of prostate cancer and ongoing controversy regarding screening and potential treatment outcomes of prostate cancer has been discussed with the patient. The meaning of a false positive PSA and a false negative PSA has been discussed. He indicates understanding of the limitations of this screening test and wishes to proceed when 66 with screening PSA testing -- no family history of prostate cancer.   IMMUNIZATIONS:   - Tdap: Tetanus vaccination status reviewed: last tetanus booster within 10 years. - Influenza: Up to date - Pneumovax: Not applicable - Prevnar: Not applicable - Zostavax vaccine: Not applicable  SCREENING: - Colonoscopy: Up to date  Discussed with patient purpose of the colonoscopy is to detect colon cancer at curable precancerous or early stages   - AAA Screening: Not applicable  -Hearing Test: Not applicable  -Spirometry: Not applicable   PATIENT COUNSELING:    Sexuality: Discussed sexually transmitted diseases, partner selection, use of condoms, avoidance of unintended pregnancy  and contraceptive alternatives.   Advised to avoid cigarette smoking.  I discussed with the patient that most people either abstain from alcohol or drink within safe limits (<=14/week and <=4 drinks/occasion for males, <=7/weeks and <= 3 drinks/occasion for females) and that the risk for alcohol disorders and other health effects rises proportionally with the number of drinks per week and how often a drinker exceeds daily limits.  Discussed cessation/primary prevention of drug use and availability of treatment for abuse.   Diet: Encouraged to adjust caloric intake to maintain  or achieve ideal body weight,  to reduce intake of dietary saturated fat and total fat, to limit sodium intake by avoiding high sodium foods and not adding table salt, and to maintain adequate dietary potassium and calcium preferably from fresh fruits, vegetables, and low-fat dairy products.    Stressed the importance of regular exercise  Injury prevention: Discussed safety belts, safety helmets, smoke detector, smoking near bedding or upholstery.   Dental health: Discussed importance of regular tooth brushing, flossing, and dental visits.   Follow up plan: NEXT PREVENTATIVE PHYSICAL DUE IN 1 YEAR. Return in about 1 year (around 08/25/2022) for Annual physical.

## 2021-08-24 NOTE — Assessment & Plan Note (Signed)
BP at goal today on recheck.  No medications.  Continue DASH diet focus and monitoring at home.  CBC, CMP, and TSH today.

## 2021-08-24 NOTE — Telephone Encounter (Signed)
Colonoscopy referral received. Lvm for patient to call me back.  Thanks,  [place], [place]

## 2021-08-25 ENCOUNTER — Telehealth: Payer: Self-pay

## 2021-08-25 ENCOUNTER — Other Ambulatory Visit: Payer: Self-pay

## 2021-08-25 DIAGNOSIS — Z1211 Encounter for screening for malignant neoplasm of colon: Secondary | ICD-10-CM

## 2021-08-25 LAB — COMPREHENSIVE METABOLIC PANEL
ALT: 21 IU/L (ref 0–44)
AST: 27 IU/L (ref 0–40)
Albumin/Globulin Ratio: 2.1 (ref 1.2–2.2)
Albumin: 4.6 g/dL (ref 4.0–5.0)
Alkaline Phosphatase: 68 IU/L (ref 44–121)
BUN/Creatinine Ratio: 11 (ref 9–20)
BUN: 10 mg/dL (ref 6–24)
Bilirubin Total: 0.3 mg/dL (ref 0.0–1.2)
CO2: 25 mmol/L (ref 20–29)
Calcium: 9.5 mg/dL (ref 8.7–10.2)
Chloride: 103 mmol/L (ref 96–106)
Creatinine, Ser: 0.91 mg/dL (ref 0.76–1.27)
Globulin, Total: 2.2 g/dL (ref 1.5–4.5)
Glucose: 82 mg/dL (ref 70–99)
Potassium: 4.2 mmol/L (ref 3.5–5.2)
Sodium: 141 mmol/L (ref 134–144)
Total Protein: 6.8 g/dL (ref 6.0–8.5)
eGFR: 106 mL/min/{1.73_m2} (ref >59)

## 2021-08-25 LAB — LIPID PANEL W/O CHOL/HDL RATIO
Cholesterol, Total: 164 mg/dL (ref 100–199)
HDL: 82 mg/dL (ref >39)
LDL Chol Calc (NIH): 65 mg/dL (ref 0–99)
Triglycerides: 92 mg/dL (ref 0–149)
VLDL Cholesterol Cal: 17 mg/dL (ref 5–40)

## 2021-08-25 LAB — CBC WITH DIFFERENTIAL/PLATELET
Basophils Absolute: 0.1 10*3/uL (ref 0.0–0.2)
Basos: 1 %
EOS (ABSOLUTE): 0.2 10*3/uL (ref 0.0–0.4)
Eos: 4 %
Hematocrit: 37.9 % (ref 37.5–51.0)
Hemoglobin: 13.1 g/dL (ref 13.0–17.7)
Immature Grans (Abs): 0 10*3/uL (ref 0.0–0.1)
Immature Granulocytes: 0 %
Lymphocytes Absolute: 1.9 10*3/uL (ref 0.7–3.1)
Lymphs: 53 %
MCH: 33.1 pg — ABNORMAL HIGH (ref 26.6–33.0)
MCHC: 34.6 g/dL (ref 31.5–35.7)
MCV: 96 fL (ref 79–97)
Monocytes Absolute: 0.3 10*3/uL (ref 0.1–0.9)
Monocytes: 8 %
Neutrophils Absolute: 1.3 10*3/uL — ABNORMAL LOW (ref 1.4–7.0)
Neutrophils: 34 %
Platelets: 272 10*3/uL (ref 150–450)
RBC: 3.96 x10E6/uL — ABNORMAL LOW (ref 4.14–5.80)
RDW: 12.9 % (ref 11.6–15.4)
WBC: 3.7 10*3/uL (ref 3.4–10.8)

## 2021-08-25 LAB — TSH: TSH: 0.659 u[IU]/mL (ref 0.450–4.500)

## 2021-08-25 MED ORDER — NA SULFATE-K SULFATE-MG SULF 17.5-3.13-1.6 GM/177ML PO SOLN: 1.0000 | Freq: Once | ORAL | 0 refills | Status: AC

## 2021-08-25 NOTE — Telephone Encounter (Signed)
CALLED PATIENT NO ANSWER LEFT VOICEMAIL FOR A CALL BACK ? ?

## 2021-08-25 NOTE — Progress Notes (Signed)
Contacted via MyChart   Good evening Teegan, your labs overall remain stable!!  No concerns on these.  Any questions?  Keep up the great work!! Keep being stellar!!  Thank you for allowing me to participate in your care.  I appreciate you. Kindest regards, Dinisha Cai

## 2021-08-25 NOTE — Progress Notes (Unsigned)
Gastroenterology Pre-Procedure Review  Request Date: 09/08/2021 Requesting Physician: Dr. Vicente Males  PATIENT REVIEW QUESTIONS: The patient responded to the following health history questions as indicated:    1. Are you having any GI issues? no 2. Do you have a personal history of Polyps? no 3. Do you have a family history of Colon Cancer or Polyps? no 4. Diabetes Mellitus? no 5. Joint replacements in the past 12 months?no 6. Major health problems in the past 3 months?no 7. Any artificial heart valves, MVP, or defibrillator?no    MEDICATIONS & ALLERGIES:    Patient reports the following regarding taking any anticoagulation/antiplatelet therapy:   Plavix, Coumadin, Eliquis, Xarelto, Lovenox, Pradaxa, Brilinta, or Effient? no Aspirin? no  Patient confirms/reports the following medications:  No current outpatient medications on file.   No current facility-administered medications for this visit.    Patient confirms/reports the following allergies:  No Known Allergies  No orders of the defined types were placed in this encounter.   AUTHORIZATION INFORMATION Primary Insurance: 1D#: Group #:  Secondary Insurance: 1D#: Group #:  SCHEDULE INFORMATION: Date: 09/08/2021 Time: Location:[facility]

## 2021-09-08 ENCOUNTER — Encounter: Payer: Self-pay | Admitting: Gastroenterology

## 2021-09-08 ENCOUNTER — Ambulatory Visit: Payer: BC Managed Care – PPO | Admitting: Anesthesiology

## 2021-09-08 ENCOUNTER — Ambulatory Visit
Admission: RE | Admit: 2021-09-08 | Discharge: 2021-09-08 | Disposition: A | Discharge status: Home / Self Care | Payer: BC Managed Care – PPO | Attending: Gastroenterology | Admitting: Gastroenterology

## 2021-09-08 ENCOUNTER — Encounter
Admission: RE | Disposition: A | Discharge status: Home / Self Care | Payer: Self-pay | Source: Home / Self Care | Attending: Gastroenterology

## 2021-09-08 DIAGNOSIS — D126 Benign neoplasm of colon, unspecified: Secondary | ICD-10-CM

## 2021-09-08 DIAGNOSIS — D123 Benign neoplasm of transverse colon: Secondary | ICD-10-CM | POA: Insufficient documentation

## 2021-09-08 DIAGNOSIS — Z1211 Encounter for screening for malignant neoplasm of colon: Secondary | ICD-10-CM | POA: Diagnosis not present

## 2021-09-08 DIAGNOSIS — F17210 Nicotine dependence, cigarettes, uncomplicated: Secondary | ICD-10-CM | POA: Insufficient documentation

## 2021-09-08 HISTORY — PX: COLONOSCOPY WITH PROPOFOL: SHX5780

## 2021-09-08 SURGERY — COLONOSCOPY WITH PROPOFOL
Anesthesia: General

## 2021-09-08 MED ORDER — MIDAZOLAM HCL 2 MG/2ML IJ SOLN
INTRAMUSCULAR | Status: AC
  Filled 2021-09-08: qty 2

## 2021-09-08 MED ORDER — PROPOFOL 10 MG/ML IV BOLUS
INTRAVENOUS | Status: DC | PRN
  Administered 2021-09-08: 50 mg via INTRAVENOUS

## 2021-09-08 MED ORDER — PROPOFOL 500 MG/50ML IV EMUL
INTRAVENOUS | Status: DC | PRN
  Administered 2021-09-08: 50 ug/kg/min via INTRAVENOUS

## 2021-09-08 MED ORDER — FENTANYL CITRATE (PF) 100 MCG/2ML IJ SOLN
INTRAMUSCULAR | Status: DC | PRN
  Administered 2021-09-08 (×2): 50 ug via INTRAVENOUS

## 2021-09-08 MED ORDER — FENTANYL CITRATE (PF) 100 MCG/2ML IJ SOLN
INTRAMUSCULAR | Status: AC
  Filled 2021-09-08: qty 2

## 2021-09-08 MED ORDER — LIDOCAINE HCL (CARDIAC) PF 100 MG/5ML IV SOSY
PREFILLED_SYRINGE | INTRAVENOUS | Status: DC | PRN
  Administered 2021-09-08: 50 mg via INTRAVENOUS

## 2021-09-08 MED ORDER — SODIUM CHLORIDE 0.9 % IV SOLN
INTRAVENOUS | Status: DC
  Administered 2021-09-08: 1000 mL via INTRAVENOUS

## 2021-09-08 MED ORDER — STERILE WATER FOR IRRIGATION IR SOLN
Status: DC | PRN
  Administered 2021-09-08: 60 mL

## 2021-09-08 MED ORDER — PROPOFOL 1000 MG/100ML IV EMUL
INTRAVENOUS | Status: AC
  Filled 2021-09-08: qty 100

## 2021-09-08 MED ORDER — MIDAZOLAM HCL 2 MG/2ML IJ SOLN
INTRAMUSCULAR | Status: DC | PRN
  Administered 2021-09-08: 2 mg via INTRAVENOUS

## 2021-09-08 NOTE — H&P (Signed)
Jonathon Bellows, MD [address], [place], [place], [place], [address] [address], [place], [place], [place], [address] Phone: [phone]  Fax: [phone]  Primary Care Physician:  Venita Lick, NP   Pre-Procedure History & Physical: HPI:  Donald Oconnor is a 46 y.o. male is here for an colonoscopy.   History reviewed. No pertinent past medical history.  History reviewed. No pertinent surgical history.  Prior to Admission medications   Not on File    Allergies as of 08/25/2021   (No Known Allergies)    Family History  Problem Relation Age of Onset   Diabetes Mother    Hypertension Mother    Diabetes Brother    Diabetes Maternal Grandmother    Hypertension Maternal Grandfather     Social History   Socioeconomic History   Marital status: Single    Spouse name: Not on file   Number of children: Not on file   Years of education: Not on file   Highest education level: Not on file  Occupational History   Not on file  Tobacco Use   Smoking status: Every Day    Packs/day: 0.25    Types: Cigarettes   Smokeless tobacco: Never  Vaping Use   Vaping Use: Never used  Substance and Sexual Activity   Alcohol use: Yes    Alcohol/week: 14.0 standard drinks of alcohol    Types: 14 Cans of beer per week    Comment: two 32 oz a day   Drug use: Yes    Types: Marijuana    Comment: daily   Sexual activity: Yes  Other Topics Concern   Not on file  Social History Narrative   Not on file   Social Determinants of Health   Financial Resource Strain: Low Risk  (07/16/2020)   Overall Financial Resource Strain (CARDIA)    Difficulty of Paying Living Expenses: Not hard at all  Food Insecurity: No Food Insecurity (07/16/2020)   Hunger Vital Sign    Worried About Running Out of Food in the Last Year: Never true    [place] in the Last Year: Never true  Transportation Needs: No Transportation Needs (07/16/2020)   PRAPARE - Radiographer, therapeutic (Medical): No    Lack of Transportation (Non-Medical): No  Physical Activity: Inactive (07/16/2020)   Exercise Vital Sign    Days of Exercise per Week: 0 days    Minutes of Exercise per Session: 0 min  Stress: No Stress Concern Present (07/16/2020)   [place]    Feeling of Stress : Only a little  Social Connections: Socially Isolated (07/16/2020)   Social Connection and Isolation Panel [NHANES]    Frequency of Communication with Friends and Family: More than three times a week    Frequency of Social Gatherings with Friends and Family: More than three times a week    Attends Religious Services: Never    Marine scientist or Organizations: No    Attends Music therapist: Never    Marital Status: Never married  Human resources officer Violence: Not on file    Review of Systems: See HPI, otherwise negative ROS  Physical Exam: BP 113/84   Pulse 74   Temp (!) 97.1 F (36.2 C) (Temporal)   Resp 18   Ht '5\' 10"'$  (1.778 m)   Wt 63.6 kg   SpO2 100%   BMI 20.13 kg/m  General:   Alert,  pleasant and cooperative in NAD Head:  Normocephalic and atraumatic. Neck:  Supple; no masses or thyromegaly. Lungs:  Clear throughout to auscultation, normal respiratory effort.    Heart:  +S1, +S2, Regular rate and rhythm, No edema. Abdomen:  Soft, nontender and nondistended. Normal bowel sounds, without guarding, and without rebound.   Neurologic:  Alert and  oriented x4;  grossly normal neurologically.  Impression/Plan: Donald Oconnor is here for an colonoscopy to be performed for Screening colonoscopy average risk   Risks, benefits, limitations, and alternatives regarding  colonoscopy have been reviewed with the patient.  Questions have been answered.  All parties agreeable.   Jonathon Bellows, MD  09/08/2021, 7:47 AM

## 2021-09-08 NOTE — Op Note (Signed)
[facility] Gastroenterology Patient Name: Donald Oconnor Procedure Date: 09/08/2021 7:23 AM MRN: [id number] Account #: [id number] Date of Birth: 1975/09/25 Admit Type: Outpatient Age: 46 Room: [facility] ENDO ROOM 3 Gender: Male Note Status: Finalized Instrument Name: Park Meo [id number] Procedure:             Colonoscopy Indications:           Screening for colorectal malignant neoplasm Providers:             Jonathon Bellows MD, MD Referring MD:          Barbaraann Faster. Ned Card (Referring MD) Medicines:             Monitored Anesthesia Care Complications:         No immediate complications. Procedure:             Pre-Anesthesia Assessment:                        - Prior to the procedure, a History and Physical was                         performed, and patient medications, allergies and                         sensitivities were reviewed. The patient's tolerance                         of previous anesthesia was reviewed.                        - The risks and benefits of the procedure and the                         sedation options and risks were discussed with the                         patient. All questions were answered and informed                         consent was obtained.                        - ASA Grade Assessment: II - A patient with mild                         systemic disease.                        After obtaining informed consent, the colonoscope was                         passed under direct vision. Throughout the procedure,                         the patient's blood pressure, pulse, and oxygen                         saturations were monitored continuously. The                         Colonoscope was introduced  through the anus and                         advanced to the the cecum, identified by the                         appendiceal orifice. The colonoscopy was performed                         with ease. The patient tolerated the procedure well.                          The quality of the bowel preparation was excellent. Findings:      The perianal and digital rectal examinations were normal.      A 5 mm polyp was found in the transverse colon. The polyp was sessile.       The polyp was removed with a cold snare. Resection and retrieval were       complete.      The exam was otherwise without abnormality on direct and retroflexion       views. Impression:            - One 5 mm polyp in the transverse colon, removed with                         a cold snare. Resected and retrieved.                        - The examination was otherwise normal on direct and                         retroflexion views. Recommendation:        - Discharge patient to home (with escort).                        - Resume previous diet.                        - Continue present medications.                        - Await pathology results.                        - Repeat colonoscopy for surveillance based on                         pathology results. Procedure Code(s):     --- Professional ---                        [phone], Colonoscopy, flexible; with removal of                         tumor(s), polyp(s), or other lesion(s) by snare                         technique Diagnosis Code(s):     --- Professional ---  Z12.11, Encounter for screening for malignant neoplasm                         of colon                        K63.5, Polyp of colon CPT copyright 2019 American Medical Association. All rights reserved. The codes documented in this report are preliminary and upon coder review may  be revised to meet current compliance requirements. Jonathon Bellows, MD Jonathon Bellows MD, MD 09/08/2021 8:08:56 AM This report has been signed electronically. Number of Addenda: 0 Note Initiated On: 09/08/2021 7:23 AM Scope Withdrawal Time: 0 hours 9 minutes 12 seconds  Total Procedure Duration: 0 hours 15 minutes 32 seconds  Estimated Blood Loss:  Estimated  blood loss: none.      [facility]

## 2021-09-08 NOTE — Anesthesia Preprocedure Evaluation (Signed)
Anesthesia Evaluation  Patient identified by MRN, date of birth, ID band Patient awake    Reviewed: Allergy & Precautions, NPO status , Patient's Chart, lab work & pertinent test results  Airway Mallampati: II  TM Distance: >3 FB Neck ROM: full    Dental  (+) Chipped   Pulmonary neg pulmonary ROS, Current Smoker and Patient abstained from smoking.,    Pulmonary exam normal        Cardiovascular negative cardio ROS Normal cardiovascular exam     Neuro/Psych negative neurological ROS  negative psych ROS   GI/Hepatic negative GI ROS, Neg liver ROS,   Endo/Other  negative endocrine ROS  Renal/GU negative Renal ROS  negative genitourinary   Musculoskeletal   Abdominal   Peds  Hematology negative hematology ROS (+)   Anesthesia Other Findings History reviewed. No pertinent past medical history.  History reviewed. No pertinent surgical history.  BMI    Body Mass Index: 20.13 kg/m    Patient advised to stop smoking.  Reproductive/Obstetrics negative OB ROS                             Anesthesia Physical Anesthesia Plan  ASA: 2  Anesthesia Plan: General   Post-op Pain Management:    Induction: Intravenous  PONV Risk Score and Plan: Propofol infusion and TIVA  Airway Management Planned: Mask  Additional Equipment:   Intra-op Plan:   Post-operative Plan:   Informed Consent: I have reviewed the patients History and Physical, chart, labs and discussed the procedure including the risks, benefits and alternatives for the proposed anesthesia with the patient or authorized representative who has indicated his/her understanding and acceptance.     Dental Advisory Given  Plan Discussed with: Anesthesiologist, CRNA and Surgeon  Anesthesia Plan Comments:         Anesthesia Quick Evaluation

## 2021-09-08 NOTE — Transfer of Care (Signed)
Immediate Anesthesia Transfer of Care Note  Patient: Donald Oconnor  Procedure(s) Performed: COLONOSCOPY WITH PROPOFOL  Patient Location: PACU  Anesthesia Type:General  Level of Consciousness: sedated  Airway & Oxygen Therapy: Patient Spontanous Breathing and Patient connected to nasal cannula oxygen  Post-op Assessment: Report given to RN and Post -op Vital signs reviewed and stable  Post vital signs: Reviewed and stable  Last Vitals:  Vitals Value Taken Time  BP 118/78 09/08/21 0810  Temp 35.9 C 09/08/21 0810  Pulse 63 09/08/21 0811  Resp 31 09/08/21 0811  SpO2 100 % 09/08/21 0811  Vitals shown include unvalidated device data.  Last Pain:  Vitals:   09/08/21 0810  TempSrc: Temporal  PainSc: Asleep         Complications: No notable events documented.

## 2021-09-08 NOTE — Anesthesia Postprocedure Evaluation (Signed)
Anesthesia Post Note  Patient: Donald Oconnor  Procedure(s) Performed: COLONOSCOPY WITH PROPOFOL  Patient location during evaluation: Endoscopy Anesthesia Type: General Level of consciousness: awake and alert Pain management: pain level controlled Vital Signs Assessment: post-procedure vital signs reviewed and stable Respiratory status: spontaneous breathing, nonlabored ventilation, respiratory function stable and patient connected to nasal cannula oxygen Cardiovascular status: blood pressure returned to baseline and stable Postop Assessment: no apparent nausea or vomiting Anesthetic complications: no   No notable events documented.   Last Vitals:  Vitals:   09/08/21 0706 09/08/21 0810  BP: 113/84 118/78  Pulse: 74   Resp: 18   Temp: (!) 36.2 C (!) 35.9 C  SpO2: 100%     Last Pain:  Vitals:   09/08/21 0830  TempSrc:   PainSc: 0-No pain                 Dimas Millin

## 2021-09-09 ENCOUNTER — Encounter: Payer: Self-pay | Admitting: Gastroenterology

## 2021-09-09 LAB — SURGICAL PATHOLOGY

## 2022-08-26 NOTE — Patient Instructions (Signed)
[place] coverage!!  Be Involved in Your Health Care:  Taking Medications When medications are taken as directed, they can greatly improve your health. But if they are not taken as instructed, they may not work. In some cases, not taking them correctly can be harmful. To help ensure your treatment remains effective and safe, understand your medications and how to take them.  Your lab results, notes and after visit summary will be available on My Chart. We strongly encourage you to use this feature. If lab results are abnormal the clinic will contact you with the appropriate steps. If the clinic does not contact you assume the results are satisfactory. You can always see your results on My Chart. If you have questions regarding your condition, please contact the clinic during office hours. You can also ask questions on My Chart.  We at [facility] are grateful that you chose us to provide care. We strive to provide excellent and compassionate care and are always looking for feedback. If you get a survey from the clinic please complete this.   Healthy Eating, Adult Healthy eating may help you get and keep a healthy body weight, reduce the risk of chronic disease, and live a long and productive life. It is important to follow a healthy eating pattern. Your nutritional and calorie needs should be met mainly by different nutrient-rich foods. What are tips for following this plan? Reading food labels Read labels and choose the following: Reduced or low sodium products. Juices with 100% fruit juice. Foods with low saturated fats (<3 g per serving) and high polyunsaturated and monounsaturated fats. Foods with whole grains, such as whole wheat, cracked wheat, brown rice, and wild rice. Whole grains that are fortified with folic acid. This is recommended for females who are pregnant or who want to become pregnant. Read labels and do not eat or drink the following: Foods or drinks with added  sugars. These include foods that contain brown sugar, corn sweetener, corn syrup, dextrose, fructose, glucose, high-fructose corn syrup, honey, invert sugar, lactose, malt syrup, maltose, molasses, raw sugar, sucrose, trehalose, or turbinado sugar. Limit your intake of added sugars to less than 10% of your total daily calories. Do not eat more than the following amounts of added sugar per day: 6 teaspoons (25 g) for females. 9 teaspoons (38 g) for males. Foods that contain processed or refined starches and grains. Refined grain products, such as white flour, degermed cornmeal, white bread, and white rice. Shopping Choose nutrient-rich snacks, such as vegetables, whole fruits, and nuts. Avoid high-calorie and high-sugar snacks, such as potato chips, fruit snacks, and candy. Use oil-based dressings and spreads on foods instead of solid fats such as butter, margarine, sour cream, or cream cheese. Limit pre-made sauces, mixes, and "instant" products such as flavored rice, instant noodles, and ready-made pasta. Try more plant-protein sources, such as tofu, tempeh, black beans, edamame, lentils, nuts, and seeds. Explore eating plans such as the Mediterranean diet or vegetarian diet. Try heart-healthy dips made with beans and healthy fats like hummus and guacamole. Vegetables go great with these. Cooking Use oil to saut or stir-fry foods instead of solid fats such as butter, margarine, or lard. Try baking, boiling, grilling, or broiling instead of frying. Remove the fatty part of meats before cooking. Steam vegetables in water or broth. Meal planning  At meals, imagine dividing your plate into fourths: One-half of your plate is fruits and vegetables. One-fourth of your plate is whole grains. One-fourth of your plate   is protein, especially lean meats, poultry, eggs, tofu, beans, or nuts. Include low-fat dairy as part of your daily diet. Lifestyle Choose healthy options in all settings, including  home, work, school, restaurants, or stores. Prepare your food safely: Wash your hands after handling raw meats. Where you prepare food, keep surfaces clean by regularly washing with hot, soapy water. Keep raw meats separate from ready-to-eat foods, such as fruits and vegetables. Cook seafood, meat, poultry, and eggs to the recommended temperature. Get a food thermometer. Store foods at safe temperatures. In general: Keep cold foods at 40F (4.4C) or below. Keep hot foods at 140F (60C) or above. Keep your freezer at 0F (-17.8C) or below. Foods are not safe to eat if they have been between the temperatures of 40-140F (4.4-60C) for more than 2 hours. What foods should I eat? Fruits Aim to eat 1-2 cups of fresh, canned (in natural juice), or frozen fruits each day. One cup of fruit equals 1 small apple, 1 large banana, 8 large strawberries, 1 cup (237 g) canned fruit,  cup (82 g) dried fruit, or 1 cup (240 mL) 100% juice. Vegetables Aim to eat 2-4 cups of fresh and frozen vegetables each day, including different varieties and colors. One cup of vegetables equals 1 cup (91 g) broccoli or cauliflower florets, 2 medium carrots, 2 cups (150 g) raw, leafy greens, 1 large tomato, 1 large bell pepper, 1 large sweet potato, or 1 medium white potato. Grains Aim to eat 5-10 ounce-equivalents of whole grains each day. Examples of 1 ounce-equivalent of grains include 1 slice of bread, 1 cup (40 g) ready-to-eat cereal, 3 cups (24 g) popcorn, or  cup (93 g) cooked rice. Meats and other proteins Try to eat 5-7 ounce-equivalents of protein each day. Examples of 1 ounce-equivalent of protein include 1 egg,  oz nuts (12 almonds, 24 pistachios, or 7 walnut halves), 1/4 cup (90 g) cooked beans, 6 tablespoons (90 g) hummus or 1 tablespoon (16 g) peanut butter. A cut of meat or fish that is the size of a deck of cards is about 3-4 ounce-equivalents (85 g). Of the protein you eat each week, try to have at least  8 sounce (227 g) of seafood. This is about 2 servings per week. This includes salmon, trout, herring, sardines, and anchovies. Dairy Aim to eat 3 cup-equivalents of fat-free or low-fat dairy each day. Examples of 1 cup-equivalent of dairy include 1 cup (240 mL) milk, 8 ounces (250 g) yogurt, 1 ounces (44 g) natural cheese, or 1 cup (240 mL) fortified soy milk. Fats and oils Aim for about 5 teaspoons (21 g) of fats and oils per day. Choose monounsaturated fats, such as canola and olive oils, mayonnaise made with olive oil or avocado oil, avocados, peanut butter, and most nuts, or polyunsaturated fats, such as sunflower, corn, and soybean oils, walnuts, pine nuts, sesame seeds, sunflower seeds, and flaxseed. Beverages Aim for 6 eight-ounce glasses of water per day. Limit coffee to 3-5 eight-ounce cups per day. Limit caffeinated beverages that have added calories, such as soda and energy drinks. If you drink alcohol: Limit how much you have to: 0-1 drink a day if you are male. 0-2 drinks a day if you are male. Know how much alcohol is in your drink. In the [place] one drink is one 12 oz bottle of beer (355 mL), one 5 oz glass of wine (148 mL), or one 1 oz glass of hard liquor (44 mL). Seasoning and other foods Try   not to add too much salt to your food. Try using herbs and spices instead of salt. Try not to add sugar to food. This information is based on [place] nutrition guidelines. To learn more, visit choosemyplate.gov. Exact amounts may vary. You may need different amounts. This information is not intended to replace advice given to you by your health care provider. Make sure you discuss any questions you have with your health care provider. Document Revised: 11/14/2021 Document Reviewed: 11/14/2021 Elsevier Patient Education  2024 [employer]  

## 2022-08-28 ENCOUNTER — Encounter: Payer: Self-pay | Admitting: Nurse Practitioner

## 2022-08-28 ENCOUNTER — Ambulatory Visit (INDEPENDENT_AMBULATORY_CARE_PROVIDER_SITE_OTHER): Payer: BC Managed Care – PPO | Admitting: Nurse Practitioner

## 2022-08-28 VITALS — BP 138/82 | HR 67 | Ht 70.0 in | Wt 144.0 lb

## 2022-08-28 DIAGNOSIS — Z0000 Encounter for general adult medical examination without abnormal findings: Secondary | ICD-10-CM

## 2022-08-28 DIAGNOSIS — Z13220 Encounter for screening for lipoid disorders: Secondary | ICD-10-CM | POA: Diagnosis not present

## 2022-08-28 DIAGNOSIS — Z136 Encounter for screening for cardiovascular disorders: Secondary | ICD-10-CM | POA: Diagnosis not present

## 2022-08-28 DIAGNOSIS — F17220 Nicotine dependence, chewing tobacco, uncomplicated: Secondary | ICD-10-CM

## 2022-08-28 DIAGNOSIS — R030 Elevated blood-pressure reading, without diagnosis of hypertension: Secondary | ICD-10-CM

## 2022-08-28 DIAGNOSIS — Z789 Other specified health status: Secondary | ICD-10-CM

## 2022-08-28 NOTE — Assessment & Plan Note (Signed)
Drinks three 16 oz beer a day, recommend cutting back on this which would be beneficial to BP and overall health.  Will obtain CMP and CBC today. 

## 2022-08-28 NOTE — Progress Notes (Signed)
BP 138/82 (BP Location: Left Arm, Patient Position: Sitting, Cuff Size: Normal)   Pulse 67   Ht 5\' 10"  (1.778 m)   Wt 144 lb (65.3 kg)   SpO2 99%   BMI 20.66 kg/m    Subjective:    Patient ID: Donald Oconnor, male    DOB: [birth year], 47 y.o.   MRN: [id number]  HPI: Donald Oconnor is a 47 y.o. male presenting on 08/28/2022 for comprehensive medical examination. Current medical complaints include:none  He currently lives with: self Interim Problems from his last visit: no  Continues to smoke, smoking about 3 packs per week.  Has been smoking since age 67.  Not interested in quitting.  Continues to drink 2 16 oz beer a day.    The 10-year ASCVD risk score (Arnett DK, et al., 2019) is: 6.9%   Values used to calculate the score:     Age: 36 years     Sex: Male     Is Non-Hispanic African American: Yes     Diabetic: No     Tobacco smoker: Yes     Systolic Blood Pressure: 138 mmHg     Is BP treated: No     HDL Cholesterol: 82 mg/dL     Total Cholesterol: 164 mg/dL  Functional Status Survey: Is the patient deaf or have difficulty hearing?: No Does the patient have difficulty seeing, even when wearing glasses/contacts?: No Does the patient have difficulty concentrating, remembering, or making decisions?: No Does the patient have difficulty walking or climbing stairs?: No Does the patient have difficulty dressing or bathing?: No Does the patient have difficulty doing errands alone such as visiting a doctor's office or shopping?: No     07/04/2019   10:03 AM 08/24/2021   10:11 AM 09/08/2021    7:04 AM  Fall Risk  Falls in the past year? 0 0   Was there an injury with Fall? 0 0   Fall Risk Category Calculator 0 0   Fall Risk Category (Retired) Low Low   (RETIRED) Patient Fall Risk Level Low fall risk Low fall risk Low fall risk  Patient at Risk for Falls Due to  No Fall Risks   Fall risk Follow up Falls evaluation completed Falls evaluation completed        08/28/2022     9:59 AM 08/24/2021   10:10 AM 07/16/2020    3:01 PM 07/04/2019   10:04 AM 06/06/2019   10:57 AM  Depression screen PHQ 2/9  Decreased Interest 0 0 0 0 0  Down, Depressed, Hopeless 0 0 0 0 0  PHQ - 2 Score 0 0 0 0 0  Altered sleeping 0 0   0  Tired, decreased energy 1 1   0  Change in appetite 0 0   0  Feeling bad or failure about yourself  0 0   0  Trouble concentrating 0 0   0  Moving slowly or fidgety/restless 0 0   0  Suicidal thoughts 0 0   0  PHQ-9 Score 1 1   0  Difficult doing work/chores Not difficult at all Not difficult at all          08/28/2022   10:00 AM 08/24/2021   10:10 AM 06/06/2019   10:56 AM  GAD 7 : Generalized Anxiety Score  Nervous, Anxious, on Edge 0 0 0  Control/stop worrying 0 0 0  Worry too much - different things 0 1 0  Trouble relaxing 0  0 0  Restless 0 0 0  Easily annoyed or irritable 0 0 0  Afraid - awful might happen 0 0 0  Total GAD 7 Score 0 1 0  Anxiety Difficulty Not difficult at all Not difficult at all Not difficult at all   Advanced Directives <no information>  Past Medical History:  History reviewed. No pertinent past medical history.  Surgical History:  Past Surgical History:  Procedure Laterality Date   COLONOSCOPY WITH PROPOFOL N/A 09/08/2021   Procedure: COLONOSCOPY WITH PROPOFOL;  Surgeon: Wyline Mood, MD;  Location: [facility] ENDOSCOPY;  Service: Gastroenterology;  Laterality: N/A;    Medications:  No current outpatient medications on file prior to visit.   No current facility-administered medications on file prior to visit.    Allergies:  No Known Allergies  Social History:  Social History   Socioeconomic History   Marital status: Single    Spouse name: Not on file   Number of children: Not on file   Years of education: Not on file   Highest education level: Not on file  Occupational History   Not on file  Tobacco Use   Smoking status: Every Day    Packs/day: .25    Types: Cigarettes   Smokeless tobacco: Never   Vaping Use   Vaping Use: Never used  Substance and Sexual Activity   Alcohol use: Yes    Alcohol/week: 14.0 standard drinks of alcohol    Types: 14 Cans of beer per week    Comment: two 32 oz a day   Drug use: Yes    Types: Marijuana    Comment: daily   Sexual activity: Yes  Other Topics Concern   Not on file  Social History Narrative   Not on file   Social Determinants of Health   Financial Resource Strain: Low Risk  (07/16/2020)   Overall Financial Resource Strain (CARDIA)    Difficulty of Paying Living Expenses: Not hard at all  Food Insecurity: No Food Insecurity (07/16/2020)   Hunger Vital Sign    Worried About Running Out of Food in the Last Year: Never true    Ran Out of Food in the Last Year: Never true  Transportation Needs: No Transportation Needs (07/16/2020)   PRAPARE - Administrator, Civil Service (Medical): No    Lack of Transportation (Non-Medical): No  Physical Activity: Inactive (07/16/2020)   Exercise Vital Sign    Days of Exercise per Week: 0 days    Minutes of Exercise per Session: 0 min  Stress: No Stress Concern Present (07/16/2020)   [employer] of Occupational Health - Occupational Stress Questionnaire    Feeling of Stress : Only a little  Social Connections: Socially Isolated (07/16/2020)   Social Connection and Isolation Panel [NHANES]    Frequency of Communication with Friends and Family: More than three times a week    Frequency of Social Gatherings with Friends and Family: More than three times a week    Attends Religious Services: Never    Database administrator or Organizations: No    Attends Engineer, structural: Never    Marital Status: Never married  Catering manager Violence: Not on file   Social History   Tobacco Use  Smoking Status Every Day   Packs/day: .25   Types: Cigarettes  Smokeless Tobacco Never   Social History   Substance and Sexual Activity  Alcohol Use Yes   Alcohol/week: 14.0 standard  drinks of alcohol   Types:  14 Cans of beer per week   Comment: two 32 oz a day   Family History:  Family History  Problem Relation Age of Onset   Diabetes Mother    Hypertension Mother    Diabetes Brother    Diabetes Maternal Grandmother    Hypertension Maternal Grandfather     Past medical history, surgical history, medications, allergies, family history and social history reviewed with patient today and changes made to appropriate areas of the chart.   ROS All other ROS negative except what is listed above and in the HPI.      Objective:    BP 138/82 (BP Location: Left Arm, Patient Position: Sitting, Cuff Size: Normal)   Pulse 67   Ht 5\' 10"  (1.778 m)   Wt 144 lb (65.3 kg)   SpO2 99%   BMI 20.66 kg/m   Wt Readings from Last 3 Encounters:  08/28/22 144 lb (65.3 kg)  09/08/21 140 lb 4.5 oz (63.6 kg)  08/24/21 144 lb 9.6 oz (65.6 kg)   Physical Exam Vitals and nursing note reviewed.  Constitutional:      General: He is awake. He is not in acute distress.    Appearance: He is well-developed and well-groomed. He is not ill-appearing or toxic-appearing.  HENT:     Head: Normocephalic and atraumatic.     Right Ear: Hearing, tympanic membrane, ear canal and external ear normal. No drainage.     Left Ear: Hearing, tympanic membrane, ear canal and external ear normal. No drainage.     Nose: Nose normal.     Mouth/Throat:     Pharynx: Uvula midline.  Eyes:     General: Lids are normal.        Right eye: No discharge.        Left eye: No discharge.     Extraocular Movements: Extraocular movements intact.     Conjunctiva/sclera: Conjunctivae normal.     Pupils: Pupils are equal, round, and reactive to light.     Visual Fields: Right eye visual fields normal and left eye visual fields normal.  Neck:     Thyroid: No thyromegaly.     Vascular: No carotid bruit or JVD.     Trachea: Trachea normal.  Cardiovascular:     Rate and Rhythm: Normal rate and regular rhythm.      Heart sounds: Normal heart sounds, S1 normal and S2 normal. No murmur heard.    No gallop.  Pulmonary:     Effort: Pulmonary effort is normal. No accessory muscle usage or respiratory distress.     Breath sounds: Normal breath sounds.  Abdominal:     General: Bowel sounds are normal.     Palpations: Abdomen is soft. There is no hepatomegaly or splenomegaly.     Tenderness: There is no abdominal tenderness.  Musculoskeletal:        General: Normal range of motion.     Cervical back: Normal range of motion and neck supple.     Right lower leg: No edema.     Left lower leg: No edema.  Lymphadenopathy:     Head:     Right side of head: No submental, submandibular, tonsillar, preauricular or posterior auricular adenopathy.     Left side of head: No submental, submandibular, tonsillar, preauricular or posterior auricular adenopathy.     Cervical: No cervical adenopathy.  Skin:    General: Skin is warm and dry.     Capillary Refill: Capillary refill takes less than 2  seconds.     Findings: No rash.  Neurological:     Mental Status: He is alert and oriented to person, place, and time.     Gait: Gait is intact.     Deep Tendon Reflexes: Reflexes are normal and symmetric.     Reflex Scores:      Brachioradialis reflexes are 2+ on the right side and 2+ on the left side.      Patellar reflexes are 2+ on the right side and 2+ on the left side. Psychiatric:        Attention and Perception: Attention normal.        Mood and Affect: Mood normal.        Speech: Speech normal.        Behavior: Behavior normal. Behavior is cooperative.        Thought Content: Thought content normal.        Cognition and Memory: Cognition normal.      Results for orders placed or performed during the hospital encounter of 09/08/21  Surgical pathology  Result Value Ref Range   SURGICAL PATHOLOGY      SURGICAL PATHOLOGY CASE: [id number] PATIENT: Sem Nesler Surgical Pathology Report     Specimen  Submitted: A. Colon polyp, transverse; cold snare  Clinical History: Colon cancer screening.  Z12.11.  Colon polyp      DIAGNOSIS: A.  COLON POLYP, TRANSVERSE; COLD SNARE: - TUBULAR ADENOMA. - NEGATIVE FOR HIGH-GRADE DYSPLASIA AND MALIGNANCY.  GROSS DESCRIPTION: A. Labeled: Transverse colon polyp cold snare Received: Formalin Collection time: 7:56 AM on 09/08/2021 Placed into formalin time: 7:56 AM on 09/08/2021 Tissue fragment(s): 1 Size: 0.2 x 0.2 x 0.1 cm Description: Tan soft tissue fragment Entirely submitted in 1 cassette.  CM 09/08/2021  Final Diagnosis performed by Elijah Birk, MD.   Electronically signed 09/09/2021 10:05:14AM The electronic signature indicates that the named Attending Pathologist has evaluated the specimen Technical component performed at [facility], [address] [place], [place] [address] Lab: [phone] Dir: Lejend Dalby Schimke,  MD, MMM  Professional component performed at [facility], [facility], [address] [place], [place], [place] [address] Lab: [phone] Dir: Beryle Quant, MD       Assessment & Plan:   Problem List Items Addressed This Visit       Other   Alcohol use    Drinks three 16 oz beer a day, recommend cutting back on this which would be beneficial to BP and overall health.  Will obtain CMP and CBC today.      Relevant Orders   Comprehensive metabolic panel   Elevated BP without diagnosis of hypertension - Primary    Ongoing, some white coat is present.  BP at goal today on recheck.  No medications.  Continue DASH diet focus and monitoring at home + highly recommend he cut back on alcohol use and smoking.  CBC, CMP, and TSH today.  Initiated medication as needed if consistent elevations.  Recommend he check BP a few times a week at home and document for visits.      Relevant Orders   CBC with Differential/Platelet   Comprehensive metabolic panel   TSH   Nicotine dependence, chewing tobacco, uncomplicated    I  have recommended complete cessation of tobacco use. I have discussed various options available for assistance with tobacco cessation including over the counter methods (Nicotine gum, patch and lozenges). We also discussed prescription options (Chantix, Nicotine Inhaler / Nasal Spray). The patient is not interested in pursuing  any prescription tobacco cessation options at this time.       Other Visit Diagnoses     Encounter for lipid screening for cardiovascular disease       Relevant Orders   Lipid Panel w/o Chol/HDL Ratio   Encounter for annual physical exam       Annual physical today with labs and health maintenance reviewed, discussed with patient.        Discussed aspirin prophylaxis for myocardial infarction prevention and decision was it was not indicated  LABORATORY TESTING:  Health maintenance labs ordered today as discussed above.   The natural history of prostate cancer and ongoing controversy regarding screening and potential treatment outcomes of prostate cancer has been discussed with the patient. The meaning of a false positive PSA and a false negative PSA has been discussed. He indicates understanding of the limitations of this screening test and wishes to proceed when 83 with screening PSA testing -- no family history of prostate cancer.   IMMUNIZATIONS:   - Tdap: Tetanus vaccination status reviewed: last tetanus booster within 10 years. - Influenza: Up to date - Pneumovax: Not applicable - Prevnar: Not applicable - Zostavax vaccine: Not applicable  SCREENING: - Colonoscopy: Up to date  Discussed with patient purpose of the colonoscopy is to detect colon cancer at curable precancerous or early stages   - AAA Screening: Not applicable  -Hearing Test: Not applicable  -Spirometry: Not applicable   PATIENT COUNSELING:    Sexuality: Discussed sexually transmitted diseases, partner selection, use of condoms, avoidance of unintended pregnancy  and contraceptive  alternatives.   Advised to avoid cigarette smoking.  I discussed with the patient that most people either abstain from alcohol or drink within safe limits (<=14/week and <=4 drinks/occasion for males, <=7/weeks and <= 3 drinks/occasion for females) and that the risk for alcohol disorders and other health effects rises proportionally with the number of drinks per week and how often a drinker exceeds daily limits.  Discussed cessation/primary prevention of drug use and availability of treatment for abuse.   Diet: Encouraged to adjust caloric intake to maintain  or achieve ideal body weight, to reduce intake of dietary saturated fat and total fat, to limit sodium intake by avoiding high sodium foods and not adding table salt, and to maintain adequate dietary potassium and calcium preferably from fresh fruits, vegetables, and low-fat dairy products.    Stressed the importance of regular exercise  Injury prevention: Discussed safety belts, safety helmets, smoke detector, smoking near bedding or upholstery.   Dental health: Discussed importance of regular tooth brushing, flossing, and dental visits.   Follow up plan: NEXT PREVENTATIVE PHYSICAL DUE IN 1 YEAR. Return in about 1 year (around 08/28/2023) for Annual physical in one year.

## 2022-08-28 NOTE — Assessment & Plan Note (Signed)
Ongoing, some white coat is present.  BP at goal today on recheck.  No medications.  Continue DASH diet focus and monitoring at home + highly recommend he cut back on alcohol use and smoking.  CBC, CMP, and TSH today.  Initiated medication as needed if consistent elevations.  Recommend he check BP a few times a week at home and document for visits.

## 2022-08-28 NOTE — Assessment & Plan Note (Signed)
I have recommended complete cessation of tobacco use. I have discussed various options available for assistance with tobacco cessation including over the counter methods (Nicotine gum, patch and lozenges). We also discussed prescription options (Chantix, Nicotine Inhaler / Nasal Spray). The patient is not interested in pursuing any prescription tobacco cessation options at this time.  

## 2022-08-29 ENCOUNTER — Other Ambulatory Visit: Payer: Self-pay | Admitting: Nurse Practitioner

## 2022-08-29 DIAGNOSIS — D649 Anemia, unspecified: Secondary | ICD-10-CM

## 2022-08-29 LAB — CBC WITH DIFFERENTIAL/PLATELET
Basophils Absolute: 0 10*3/uL (ref 0.0–0.2)
Basos: 1 %
EOS (ABSOLUTE): 0.2 10*3/uL (ref 0.0–0.4)
Eos: 5 %
Hematocrit: 39.1 % (ref 37.5–51.0)
Hemoglobin: 12.9 g/dL — ABNORMAL LOW (ref 13.0–17.7)
Immature Grans (Abs): 0 10*3/uL (ref 0.0–0.1)
Immature Granulocytes: 0 %
Lymphocytes Absolute: 2 10*3/uL (ref 0.7–3.1)
Lymphs: 46 %
MCH: 31.8 pg (ref 26.6–33.0)
MCHC: 33 g/dL (ref 31.5–35.7)
MCV: 96 fL (ref 79–97)
Monocytes Absolute: 0.4 10*3/uL (ref 0.1–0.9)
Monocytes: 10 %
Neutrophils Absolute: 1.6 10*3/uL (ref 1.4–7.0)
Neutrophils: 38 %
Platelets: 337 10*3/uL (ref 150–450)
RBC: 4.06 x10E6/uL — ABNORMAL LOW (ref 4.14–5.80)
RDW: 13.1 % (ref 11.6–15.4)
WBC: 4.2 10*3/uL (ref 3.4–10.8)

## 2022-08-29 LAB — COMPREHENSIVE METABOLIC PANEL
ALT: 13 IU/L (ref 0–44)
AST: 23 IU/L (ref 0–40)
Albumin: 4.5 g/dL (ref 4.1–5.1)
Alkaline Phosphatase: 71 IU/L (ref 44–121)
BUN/Creatinine Ratio: 11 (ref 9–20)
BUN: 11 mg/dL (ref 6–24)
Bilirubin Total: 0.4 mg/dL (ref 0.0–1.2)
CO2: 25 mmol/L (ref 20–29)
Calcium: 9.3 mg/dL (ref 8.7–10.2)
Chloride: 102 mmol/L (ref 96–106)
Creatinine, Ser: 0.99 mg/dL (ref 0.76–1.27)
Globulin, Total: 2 g/dL (ref 1.5–4.5)
Glucose: 74 mg/dL (ref 70–99)
Potassium: 4.1 mmol/L (ref 3.5–5.2)
Sodium: 141 mmol/L (ref 134–144)
Total Protein: 6.5 g/dL (ref 6.0–8.5)
eGFR: 95 mL/min/{1.73_m2} (ref >59)

## 2022-08-29 LAB — TSH: TSH: 0.81 u[IU]/mL (ref 0.450–4.500)

## 2022-08-29 LAB — LIPID PANEL W/O CHOL/HDL RATIO
Cholesterol, Total: 158 mg/dL (ref 100–199)
HDL: 84 mg/dL (ref >39)
LDL Chol Calc (NIH): 64 mg/dL (ref 0–99)
Triglycerides: 48 mg/dL (ref 0–149)
VLDL Cholesterol Cal: 10 mg/dL (ref 5–40)

## 2022-08-29 NOTE — Progress Notes (Signed)
Contacted via MyChart -- need lab visit in 4 weeks please   Good morning Caydn, your labs have returned and overall look wonderful, with exception of hemoglobin and red blood cells which are very mildly low.  Please cut back on alcohol intake and I would like to recheck this in 4 weeks outpatient.  Please ensure increase of deep leafy greens and iron rich red meats to help levels.  Any questions? Keep being amazing!!  Thank you for allowing me to participate in your care.  I appreciate you. Kindest regards, Kainoah Bartosiewicz

## 2022-09-26 ENCOUNTER — Other Ambulatory Visit: Payer: BC Managed Care – PPO

## 2022-09-26 DIAGNOSIS — D649 Anemia, unspecified: Secondary | ICD-10-CM

## 2022-09-27 NOTE — Progress Notes (Signed)
Contacted via MyChart   Good morning Donald Oconnor, your labs have returned and overall are looking much better and iron level is normal.  Good news!!  Ferritin is pending and if abnormal I will let you know.  For now continues focus on healthy diet and reduction of alcohol.:)  Any questions?

## 2022-09-29 ENCOUNTER — Other Ambulatory Visit: Payer: Self-pay

## 2022-09-29 ENCOUNTER — Encounter: Payer: Self-pay | Admitting: Emergency Medicine

## 2022-09-29 ENCOUNTER — Emergency Department: Payer: BC Managed Care – PPO

## 2022-09-29 DIAGNOSIS — S93402A Sprain of unspecified ligament of left ankle, initial encounter: Secondary | ICD-10-CM | POA: Insufficient documentation

## 2022-09-29 DIAGNOSIS — R000 Tachycardia, unspecified: Secondary | ICD-10-CM | POA: Diagnosis not present

## 2022-09-29 DIAGNOSIS — S99912A Unspecified injury of left ankle, initial encounter: Secondary | ICD-10-CM | POA: Diagnosis not present

## 2022-09-29 DIAGNOSIS — M79671 Pain in right foot: Secondary | ICD-10-CM | POA: Insufficient documentation

## 2022-09-29 DIAGNOSIS — M79605 Pain in left leg: Secondary | ICD-10-CM | POA: Diagnosis not present

## 2022-09-29 DIAGNOSIS — I10 Essential (primary) hypertension: Secondary | ICD-10-CM | POA: Diagnosis not present

## 2022-09-29 DIAGNOSIS — S99911A Unspecified injury of right ankle, initial encounter: Secondary | ICD-10-CM | POA: Diagnosis not present

## 2022-09-29 DIAGNOSIS — S99921A Unspecified injury of right foot, initial encounter: Secondary | ICD-10-CM | POA: Diagnosis not present

## 2022-09-29 DIAGNOSIS — Y92093 Driveway of other non-institutional residence as the place of occurrence of the external cause: Secondary | ICD-10-CM | POA: Insufficient documentation
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
## 2022-09-30 ENCOUNTER — Emergency Department: Payer: BC Managed Care – PPO

## 2022-09-30 ENCOUNTER — Emergency Department
Admission: EM | Admit: 2022-09-30 | Discharge: 2022-09-30 | Disposition: A | Discharge status: Home / Self Care | Payer: BC Managed Care – PPO | Source: Home / Self Care | Attending: Emergency Medicine | Admitting: Emergency Medicine

## 2022-09-30 DIAGNOSIS — S99921A Unspecified injury of right foot, initial encounter: Secondary | ICD-10-CM | POA: Diagnosis not present

## 2022-09-30 DIAGNOSIS — M79605 Pain in left leg: Secondary | ICD-10-CM | POA: Diagnosis not present

## 2022-09-30 DIAGNOSIS — S93402A Sprain of unspecified ligament of left ankle, initial encounter: Secondary | ICD-10-CM

## 2022-09-30 DIAGNOSIS — S99911A Unspecified injury of right ankle, initial encounter: Secondary | ICD-10-CM | POA: Diagnosis not present

## 2022-09-30 DIAGNOSIS — W19XXXA Unspecified fall, initial encounter: Secondary | ICD-10-CM

## 2022-09-30 DIAGNOSIS — S99912A Unspecified injury of left ankle, initial encounter: Secondary | ICD-10-CM | POA: Diagnosis not present

## 2022-09-30 DIAGNOSIS — S93492A Sprain of other ligament of left ankle, initial encounter: Secondary | ICD-10-CM | POA: Diagnosis not present

## 2022-09-30 MED ORDER — IBUPROFEN 600 MG PO TABS: 600.0000 mg | ORAL_TABLET | Freq: Three times a day (TID) | ORAL | 0 refills | Status: DC | PRN

## 2022-09-30 NOTE — Discharge Instructions (Addendum)
Follow-up with your doctor in 1 week for repeat X Ray and exams  Take Ibuprofen every 6-8 hours for pain  Elevate your leg for swelling and pain  Wear the brace until symptom free or for 1-2 weeks  Use crutches as needed for pain
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
## 2022-09-30 NOTE — Progress Notes (Signed)
Search warrant reviewed and appears valid. ED Charge informed.
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
## 2022-10-07 NOTE — Patient Instructions (Incomplete)
Ankle Pain The ankle joint helps you stand on your leg and allows you to move around. Ankle pain can happen on either side or the back of the ankle. You may have pain in one ankle or both ankles. Ankle pain may be sharp and burning or dull and aching. There may be tenderness, stiffness, redness, or warmth around the ankle. Many things can cause ankle pain. These include an injury to the area and overuse of your ankle. Follow these instructions at home: Activity Rest your ankle as told by your health care provider. Avoid doing things that cause ankle pain. Do not use the injured limb to support your body weight until your provider says that you can. Use crutches as told by your provider. Ask your provider when it is safe to drive if you have a brace on your ankle. Do exercises as told by your provider. If you have a removable brace: Wear the brace as told by your provider. Remove it only as told by your provider. Check the skin around the brace every day. Tell your provider about any concerns. Loosen the brace if your toes tingle, become numb, or turn cold and blue. Keep the brace clean. If the brace is not waterproof: Do not let it get wet. Cover it with a watertight covering when you take a bath or shower. If you have an elastic bandage:  Remove it when you take a bath or a shower. Try not to move your ankle much. Wiggle your toes from time to time. This helps to prevent swelling. Adjust the bandage if it feels too tight. Loosen the bandage if your foot tingles, becomes numb, or turns cold and blue. Managing pain, stiffness, and swelling  If told, put ice on the painful area. If you have a removable brace or elastic bandage, remove it as told by your provider. Put ice in a plastic bag. Place a towel between your skin and the bag. Leave the ice on for 20 minutes, 2-3 times a day. If your skin turns bright red, remove the ice right away to prevent skin damage. The risk of damage is  higher if you cannot feel pain, heat, or cold. Move your toes often to reduce stiffness and swelling. Raise (elevate) your ankle above the level of your heart while you are sitting or lying down. General instructions Take over-the-counter and prescription medicines only as told by your provider. To help you and your provider, write down: How often you have ankle pain. Where the pain is. What the pain feels like. If you are told to wear a certain shoe or insole, make sure you wear it the right way and for as long as you are told. Contact a health care provider if: Your pain gets worse. Your pain does not get better with medicine. You have a fever or chills. You have more trouble walking. You have new symptoms. Your foot, leg, toes, or ankle tingles, becomes numb or swollen, or turns cold and blue. This information is not intended to replace advice given to you by your health care provider. Make sure you discuss any questions you have with your health care provider. Document Revised: 12/07/2021 Document Reviewed: 12/07/2021 Elsevier Patient Education  2024 [employer].

## 2022-10-09 ENCOUNTER — Encounter: Payer: Self-pay | Admitting: Nurse Practitioner

## 2022-10-09 ENCOUNTER — Ambulatory Visit: Payer: BC Managed Care – PPO | Admitting: Nurse Practitioner

## 2022-10-09 VITALS — BP 130/80 | HR 80 | Temp 98.3°F | Wt 145.4 lb

## 2022-10-09 DIAGNOSIS — M25572 Pain in left ankle and joints of left foot: Secondary | ICD-10-CM | POA: Diagnosis not present

## 2022-10-09 MED ORDER — HYDROCODONE-ACETAMINOPHEN 5-325 MG PO TABS: 1.0000 | ORAL_TABLET | Freq: Four times a day (QID) | ORAL | 0 refills | Status: AC | PRN
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
## 2022-10-09 NOTE — Assessment & Plan Note (Signed)
Acute and ongoing for 1 1/2 weeks, initial imaging was negative for fractures, but concern that with activity he may have caused fracture -- significantly tender and swollen to lower medial tibia area.  Will obtain imaging and get in with ortho ASAP.  Short burst of Norco as needed sent in.

## 2022-10-10 DIAGNOSIS — M25572 Pain in left ankle and joints of left foot: Secondary | ICD-10-CM | POA: Diagnosis not present

## 2022-10-10 DIAGNOSIS — S8782XA Crushing injury of left lower leg, initial encounter: Secondary | ICD-10-CM | POA: Diagnosis not present

## 2022-10-10 DIAGNOSIS — M79672 Pain in left foot: Secondary | ICD-10-CM | POA: Diagnosis not present

## 2022-10-17 DIAGNOSIS — S8782XA Crushing injury of left lower leg, initial encounter: Secondary | ICD-10-CM | POA: Diagnosis not present

## 2023-08-25 NOTE — Patient Instructions (Signed)
 Be Involved in Caring For Your Health:  Taking Medications When medications are taken as directed, they can greatly improve your health. But if they are not taken as prescribed, they may not work. In some cases, not taking them correctly can be harmful. To help ensure your treatment remains effective and safe, understand your medications and how to take them. Bring your medications to each visit for review by your provider.  Your lab results, notes, and after visit summary will be available on My Chart. We strongly encourage you to use this feature. If lab results are abnormal the clinic will contact you with the appropriate steps. If the clinic does not contact you assume the results are satisfactory. You can always view your results on My Chart. If you have questions regarding your health or results, please contact the clinic during office hours. You can also ask questions on My Chart.  We at [facility] are grateful that you chose [place] to provide your care. We strive to provide evidence-based and compassionate care and are always looking for feedback. If you get a survey from the clinic please complete this so we can hear your opinions.  Healthy Eating, Adult Healthy eating may help you get and keep a healthy body weight, reduce the risk of chronic disease, and live a long and productive life. It is important to follow a healthy eating pattern. Your nutritional and calorie needs should be met mainly by different nutrient-rich foods. What are tips for following this plan? Reading food labels Read labels and choose the following: Reduced or low sodium products. Juices with 100% fruit juice. Foods with low saturated fats (<3 g per serving) and high polyunsaturated and monounsaturated fats. Foods with whole grains, such as whole wheat, cracked wheat, brown rice, and wild rice. Whole grains that are fortified with folic acid. This is recommended for females who are pregnant or who want  to become pregnant. Read labels and do not eat or drink the following: Foods or drinks with added sugars. These include foods that contain brown sugar, corn sweetener, corn syrup, dextrose, fructose, glucose, high-fructose corn syrup, honey, invert sugar, lactose, malt syrup, maltose, molasses, raw sugar, sucrose, trehalose, or turbinado sugar. Limit your intake of added sugars to less than 10% of your total daily calories. Do not eat more than the following amounts of added sugar per day: 6 teaspoons (25 g) for females. 9 teaspoons (38 g) for males. Foods that contain processed or refined starches and grains. Refined grain products, such as white flour, degermed cornmeal, white bread, and white rice. Shopping Choose nutrient-rich snacks, such as vegetables, whole fruits, and nuts. Avoid high-calorie and high-sugar snacks, such as potato chips, fruit snacks, and candy. Use oil-based dressings and spreads on foods instead of solid fats such as butter, margarine, sour cream, or cream cheese. Limit pre-made sauces, mixes, and "instant" products such as flavored rice, instant noodles, and ready-made pasta. Try more plant-protein sources, such as tofu, tempeh, black beans, edamame, lentils, nuts, and seeds. Explore eating plans such as the Mediterranean diet or vegetarian diet. Try heart-healthy dips made with beans and healthy fats like hummus and guacamole. Vegetables go great with these. Cooking Use oil to saut or stir-fry foods instead of solid fats such as butter, margarine, or lard. Try baking, boiling, grilling, or broiling instead of frying. Remove the fatty part of meats before cooking. Steam vegetables in water or broth. Meal planning  At meals, imagine dividing your plate into fourths: One-half of  your plate is fruits and vegetables. One-fourth of your plate is whole grains. One-fourth of your plate is protein, especially lean meats, poultry, eggs, tofu, beans, or nuts. Include  low-fat dairy as part of your daily diet. Lifestyle Choose healthy options in all settings, including home, work, school, restaurants, or stores. Prepare your food safely: Wash your hands after handling raw meats. Where you prepare food, keep surfaces clean by regularly washing with hot, soapy water. Keep raw meats separate from ready-to-eat foods, such as fruits and vegetables. Cook seafood, meat, poultry, and eggs to the recommended temperature. Get a food thermometer. Store foods at safe temperatures. In general: Keep cold foods at 76F (4.4C) or below. Keep hot foods at 176F (60C) or above. Keep your freezer at [facility] (-17.8C) or below. Foods are not safe to eat if they have been between the temperatures of 40-176F (4.4-60C) for more than 2 hours. What foods should I eat? Fruits Aim to eat 1-2 cups of fresh, canned (in natural juice), or frozen fruits each day. One cup of fruit equals 1 small apple, 1 large banana, 8 large strawberries, 1 cup (237 g) canned fruit,  cup (82 g) dried fruit, or 1 cup (240 mL) 100% juice. Vegetables Aim to eat 2-4 cups of fresh and frozen vegetables each day, including different varieties and colors. One cup of vegetables equals 1 cup (91 g) broccoli or cauliflower florets, 2 medium carrots, 2 cups (150 g) raw, leafy greens, 1 large tomato, 1 large bell pepper, 1 large sweet potato, or 1 medium white potato. Grains Aim to eat 5-10 ounce-equivalents of whole grains each day. Examples of 1 ounce-equivalent of grains include 1 slice of bread, 1 cup (40 g) ready-to-eat cereal, 3 cups (24 g) popcorn, or  cup (93 g) cooked rice. Meats and other proteins Try to eat 5-7 ounce-equivalents of protein each day. Examples of 1 ounce-equivalent of protein include 1 egg,  oz nuts (12 almonds, 24 pistachios, or 7 walnut halves), 1/4 cup (90 g) cooked beans, 6 tablespoons (90 g) hummus or 1 tablespoon (16 g) peanut butter. A cut of meat or fish that is the size of a deck  of cards is about 3-4 ounce-equivalents (85 g). Of the protein you eat each week, try to have at least 8 sounce (227 g) of seafood. This is about 2 servings per week. This includes salmon, trout, herring, sardines, and anchovies. Dairy Aim to eat 3 cup-equivalents of fat-free or low-fat dairy each day. Examples of 1 cup-equivalent of dairy include 1 cup (240 mL) milk, 8 ounces (250 g) yogurt, 1 ounces (44 g) natural cheese, or 1 cup (240 mL) fortified soy milk. Fats and oils Aim for about 5 teaspoons (21 g) of fats and oils per day. Choose monounsaturated fats, such as canola and olive oils, mayonnaise made with olive oil or avocado oil, avocados, peanut butter, and most nuts, or polyunsaturated fats, such as sunflower, corn, and soybean oils, walnuts, pine nuts, sesame seeds, sunflower seeds, and flaxseed. Beverages Aim for 6 eight-ounce glasses of water per day. Limit coffee to 3-5 eight-ounce cups per day. Limit caffeinated beverages that have added calories, such as soda and energy drinks. If you drink alcohol: Limit how much you have to: 0-1 drink a day if you are male. 0-2 drinks a day if you are male. Know how much alcohol is in your drink. In the [place] one drink is one 12 oz bottle of beer (355 mL), one 5 oz glass of wine (  148 mL), or one 1 oz glass of hard liquor (44 mL). Seasoning and other foods Try not to add too much salt to your food. Try using herbs and spices instead of salt. Try not to add sugar to food. This information is based on [place] nutrition guidelines. To learn more, visit [employer]. Exact amounts may vary. You may need different amounts. This information is not intended to replace advice given to you by your health care provider. Make sure you discuss any questions you have with your health care provider. Document Revised: 11/14/2021 Document Reviewed: 11/14/2021 Elsevier Patient Education  2024 [employer].

## 2023-08-29 ENCOUNTER — Ambulatory Visit (INDEPENDENT_AMBULATORY_CARE_PROVIDER_SITE_OTHER): Payer: Self-pay | Admitting: Nurse Practitioner

## 2023-08-29 VITALS — BP 132/78 | HR 70 | Temp 97.8°F | Ht 70.5 in | Wt 146.8 lb

## 2023-08-29 DIAGNOSIS — R030 Elevated blood-pressure reading, without diagnosis of hypertension: Secondary | ICD-10-CM | POA: Diagnosis not present

## 2023-08-29 DIAGNOSIS — F1090 Alcohol use, unspecified, uncomplicated: Secondary | ICD-10-CM | POA: Diagnosis not present

## 2023-08-29 DIAGNOSIS — Z0000 Encounter for general adult medical examination without abnormal findings: Secondary | ICD-10-CM | POA: Diagnosis not present

## 2023-08-29 DIAGNOSIS — F17220 Nicotine dependence, chewing tobacco, uncomplicated: Secondary | ICD-10-CM | POA: Diagnosis not present

## 2023-08-29 NOTE — Assessment & Plan Note (Signed)
Ongoing, some white coat is present.  BP at goal today on recheck.  No medications.  Continue DASH diet focus and monitoring at home + highly recommend he cut back on alcohol use and smoking.  CBC, CMP, and TSH today.  Initiated medication as needed if consistent elevations.  Recommend he check BP a few times a week at home and document for visits.

## 2023-08-29 NOTE — Progress Notes (Signed)
 BP 132/78 (BP Location: Left Arm, Patient Position: Sitting, Cuff Size: Normal)   Pulse 70   Temp 97.8 F (36.6 C) (Oral)   Ht 5' 10.5 (1.791 m)   Wt 146 lb 12.8 oz (66.6 kg)   SpO2 95%   BMI 20.77 kg/m    Subjective:    Patient ID: Donald Oconnor, male    DOB: November 30, 1975, 48 y.o.   MRN: [id number]  HPI: Donald Oconnor is a 48 y.o. male presenting on 08/29/2023 for comprehensive medical examination. Current medical complaints include:none  He currently lives with: self Interim Problems from his last visit: no  Continues to smoke, smoking around 3 packs per week.  Has been smoking since age 24.  Not interested in quitting.  Continues to drink two 16 oz beer a day.    The 10-year ASCVD risk score (Arnett DK, et al., 2019) is: 6.6%   Values used to calculate the score:     Age: 69 years     Clincally relevant sex: Male     Is Non-Hispanic African American: Yes     Diabetic: No     Tobacco smoker: Yes     Systolic Blood Pressure: 132 mmHg     Is BP treated: No     HDL Cholesterol: 84 mg/dL     Total Cholesterol: 158 mg/dL  Functional Status Survey:       07/04/2019   10:03 AM 08/24/2021   10:11 AM 09/08/2021    7:04 AM 08/29/2023    8:05 AM  Fall Risk  Falls in the past year? 0  0  0  Was there an injury with Fall? 0 0  0  Fall Risk Category Calculator 0 0  0  Fall Risk Category (Retired) Low  Low     (RETIRED) Patient Fall Risk Level Low fall risk  Low fall risk  Low fall risk    Patient at Risk for Falls Due to  No Fall Risks  No Fall Risks  Fall risk Follow up Falls evaluation completed  Falls evaluation completed   Falls evaluation completed     Data saved with a previous flowsheet row definition       08/29/2023    8:05 AM 08/28/2022    9:59 AM 08/24/2021   10:10 AM 07/16/2020    3:01 PM 07/04/2019   10:04 AM  Depression screen PHQ 2/9  Decreased Interest 0 0 0 0 0  Down, Depressed, Hopeless 0 0 0 0 0  PHQ - 2 Score 0 0 0 0 0  Altered sleeping 0 0 0    Tired,  decreased energy 0 1 1    Change in appetite 0 0 0    Feeling bad or failure about yourself  0 0 0    Trouble concentrating 0 0 0    Moving slowly or fidgety/restless 0 0 0    Suicidal thoughts 0 0 0    PHQ-9 Score 0 1 1    Difficult doing work/chores Not difficult at all Not difficult at all Not difficult at all         08/29/2023    8:05 AM 08/28/2022   10:00 AM 08/24/2021   10:10 AM 06/06/2019   10:56 AM  GAD 7 : Generalized Anxiety Score  Nervous, Anxious, on Edge 0 0 0 0  Control/stop worrying 0 0 0 0  Worry too much - different things 0 0 1 0  Trouble relaxing 0 0 0 0  Restless 0 0 0 0  Easily annoyed or irritable 0 0 0 0  Afraid - awful might happen 0 0 0 0  Total GAD 7 Score 0 0 1 0  Anxiety Difficulty Not difficult at all Not difficult at all Not difficult at all Not difficult at all   Advanced Directives <no information>  Past Medical History:  History reviewed. No pertinent past medical history.  Surgical History:  Past Surgical History:  Procedure Laterality Date   COLONOSCOPY WITH PROPOFOL  N/A 09/08/2021   Procedure: COLONOSCOPY WITH PROPOFOL ;  Surgeon: Therisa Bi, MD;  Location: [facility] ENDOSCOPY;  Service: Gastroenterology;  Laterality: N/A;    Medications:  No current outpatient medications on file prior to visit.   No current facility-administered medications on file prior to visit.    Allergies:  No Known Allergies  Social History:  Social History   Socioeconomic History   Marital status: Single    Spouse name: Not on file   Number of children: Not on file   Years of education: Not on file   Highest education level: 12th grade  Occupational History   Not on file  Tobacco Use   Smoking status: Every Day    Current packs/day: 0.25    Average packs/day: 0.3 packs/day for 31.0 years (7.8 ttl pk-yrs)    Types: Cigarettes   Smokeless tobacco: Never  Vaping Use   Vaping status: Never Used  Substance and Sexual Activity   Alcohol use: Yes     Alcohol/week: 14.0 standard drinks of alcohol    Types: 14 Cans of beer per week    Comment: two 16 oz drinks per day   Drug use: Yes    Types: Marijuana    Comment: daily   Sexual activity: Yes  Other Topics Concern   Not on file  Social History Narrative   Not on file   Social Drivers of Health   Financial Resource Strain: Patient Declined (08/29/2023)   Overall Financial Resource Strain (CARDIA)    Difficulty of Paying Living Expenses: Patient declined  Food Insecurity: Patient Declined (08/29/2023)   Hunger Vital Sign    Worried About Running Out of Food in the Last Year: Patient declined    Ran Out of Food in the Last Year: Patient declined  Transportation Needs: No Transportation Needs (08/29/2023)   PRAPARE - Administrator, Civil Service (Medical): No    Lack of Transportation (Non-Medical): No  Physical Activity: Unknown (08/29/2023)   Exercise Vital Sign    Days of Exercise per Week: Patient declined    Minutes of Exercise per Session: Not on file  Stress: No Stress Concern Present (08/29/2023)   [employer] of Occupational Health - Occupational Stress Questionnaire    Feeling of Stress: Not at all  Social Connections: Socially Isolated (08/29/2023)   Social Connection and Isolation Panel    Frequency of Communication with Friends and Family: Once a week    Frequency of Social Gatherings with Friends and Family: Twice a week    Attends Religious Services: Never    Database administrator or Organizations: No    Attends Engineer, structural: Not on file    Marital Status: Never married  Intimate Partner Violence: Not At Risk (08/29/2023)   Humiliation, Afraid, Rape, and Kick questionnaire    Fear of Current or Ex-Partner: No    Emotionally Abused: No    Physically Abused: No    Sexually Abused: No   Social History  Tobacco Use  Smoking Status Every Day   Current packs/day: 0.25   Average packs/day: 0.3 packs/day for 31.0 years (7.8 ttl pk-yrs)    Types: Cigarettes  Smokeless Tobacco Never   Social History   Substance and Sexual Activity  Alcohol Use Yes   Alcohol/week: 14.0 standard drinks of alcohol   Types: 14 Cans of beer per week   Comment: two 16 oz drinks per day   Family History:  Family History  Problem Relation Age of Onset   Diabetes Mother    Hypertension Mother    Diabetes Brother    Diabetes Maternal Grandmother    Hypertension Maternal Grandfather     Past medical history, surgical history, medications, allergies, family history and social history reviewed with patient today and changes made to appropriate areas of the chart.   ROS All other ROS negative except what is listed above and in the HPI.      Objective:    BP 132/78 (BP Location: Left Arm, Patient Position: Sitting, Cuff Size: Normal)   Pulse 70   Temp 97.8 F (36.6 C) (Oral)   Ht 5' 10.5 (1.791 m)   Wt 146 lb 12.8 oz (66.6 kg)   SpO2 95%   BMI 20.77 kg/m   Wt Readings from Last 3 Encounters:  08/29/23 146 lb 12.8 oz (66.6 kg)  10/09/22 145 lb 6.4 oz (66 kg)  09/29/22 137 lb (62.1 kg)   Physical Exam Vitals and nursing note reviewed.  Constitutional:      General: He is awake. He is not in acute distress.    Appearance: He is well-developed and well-groomed. He is not ill-appearing or toxic-appearing.  HENT:     Head: Normocephalic and atraumatic.     Right Ear: Hearing, tympanic membrane, ear canal and external ear normal. No drainage.     Left Ear: Hearing, tympanic membrane, ear canal and external ear normal. No drainage.     Nose: Nose normal.     Mouth/Throat:     Pharynx: Uvula midline.  Eyes:     General: Lids are normal.        Right eye: No discharge.        Left eye: No discharge.     Extraocular Movements: Extraocular movements intact.     Conjunctiva/sclera: Conjunctivae normal.     Pupils: Pupils are equal, round, and reactive to light.     Visual Fields: Right eye visual fields normal and left eye visual  fields normal.  Neck:     Thyroid: No thyromegaly.     Vascular: No carotid bruit or JVD.     Trachea: Trachea normal.  Cardiovascular:     Rate and Rhythm: Normal rate and regular rhythm.     Heart sounds: Normal heart sounds, S1 normal and S2 normal. No murmur heard.    No gallop.  Pulmonary:     Effort: Pulmonary effort is normal. No accessory muscle usage or respiratory distress.     Breath sounds: Normal breath sounds.  Abdominal:     General: Bowel sounds are normal.     Palpations: Abdomen is soft. There is no hepatomegaly or splenomegaly.     Tenderness: There is no abdominal tenderness.  Musculoskeletal:        General: Normal range of motion.     Cervical back: Normal range of motion and neck supple.     Right lower leg: No edema.     Left lower leg: No edema.  Lymphadenopathy:  Head:     Right side of head: No submental, submandibular, tonsillar, preauricular or posterior auricular adenopathy.     Left side of head: No submental, submandibular, tonsillar, preauricular or posterior auricular adenopathy.     Cervical: No cervical adenopathy.  Skin:    General: Skin is warm and dry.     Capillary Refill: Capillary refill takes less than 2 seconds.     Findings: No rash.  Neurological:     Mental Status: He is alert and oriented to person, place, and time.     Gait: Gait is intact.     Deep Tendon Reflexes: Reflexes are normal and symmetric.     Reflex Scores:      Brachioradialis reflexes are 2+ on the right side and 2+ on the left side.      Patellar reflexes are 2+ on the right side and 2+ on the left side. Psychiatric:        Attention and Perception: Attention normal.        Mood and Affect: Mood normal.        Speech: Speech normal.        Behavior: Behavior normal. Behavior is cooperative.        Thought Content: Thought content normal.        Cognition and Memory: Cognition normal.      Results for orders placed or performed in visit on 09/26/22   Vitamin B12   Collection Time: 09/26/22  8:25 AM  Result Value Ref Range   Vitamin B-12 1,202 232 - 1,245 pg/mL  Folate   Collection Time: 09/26/22  8:25 AM  Result Value Ref Range   Folate 7.2 >3.0 ng/mL  Ferritin   Collection Time: 09/26/22  8:25 AM  Result Value Ref Range   Ferritin 328 30 - 400 ng/mL  Iron Binding Cap (TIBC)([facility])   Collection Time: 09/26/22  8:25 AM  Result Value Ref Range   Total Iron Binding Capacity 308 250 - 450 ug/dL   UIBC 765 888 - 656 ug/dL   Iron 74 38 - 830 ug/dL   Iron Saturation 24 15 - 55 %  CBC with Differential/Platelet   Collection Time: 09/26/22  8:25 AM  Result Value Ref Range   WBC 4.8 3.4 - 10.8 x10E3/uL   RBC 4.09 (L) 4.14 - 5.80 x10E6/uL   Hemoglobin 13.3 13.0 - 17.7 g/dL   Hematocrit 60.6 62.4 - 51.0 %   MCV 96 79 - 97 fL   MCH 32.5 26.6 - 33.0 pg   MCHC 33.8 31.5 - 35.7 g/dL   RDW 87.1 88.3 - 84.5 %   Platelets 319 150 - 450 x10E3/uL   Neutrophils 44 Not Estab. %   Lymphs 43 Not Estab. %   Monocytes 7 Not Estab. %   Eos 5 Not Estab. %   Basos 1 Not Estab. %   Neutrophils Absolute 2.1 1.4 - 7.0 x10E3/uL   Lymphocytes Absolute 2.1 0.7 - 3.1 x10E3/uL   Monocytes Absolute 0.3 0.1 - 0.9 x10E3/uL   EOS (ABSOLUTE) 0.2 0.0 - 0.4 x10E3/uL   Basophils Absolute 0.0 0.0 - 0.2 x10E3/uL   Immature Granulocytes 0 Not Estab. %   Immature Grans (Abs) 0.0 0.0 - 0.1 x10E3/uL      Assessment & Plan:   Problem List Items Addressed This Visit       Other   Nicotine dependence, chewing tobacco, uncomplicated   I have recommended complete cessation of tobacco use. I have discussed various options  available for assistance with tobacco cessation including over the counter methods (Nicotine gum, patch and lozenges). We also discussed prescription options (Chantix, Nicotine Inhaler / Nasal Spray). The patient is not interested in pursuing any prescription tobacco cessation options at this time.       Elevated BP without diagnosis  of hypertension - Primary   Ongoing, some white coat is present.  BP at goal today on recheck.  No medications.  Continue DASH diet focus and monitoring at home + highly recommend he cut back on alcohol use and smoking.  CBC, CMP, and TSH today.  Initiated medication as needed if consistent elevations.  Recommend he check BP a few times a week at home and document for visits.      Relevant Orders   CBC with Differential/Platelet   Comprehensive metabolic panel with GFR   TSH   Lipid Panel w/o Chol/HDL Ratio   Alcohol use   Drinks two 16 oz beer a day, recommend cutting back on this which would be beneficial to BP and overall health.  Will obtain CMP, Mag, and CBC today.      Relevant Orders   Comprehensive metabolic panel with GFR   Magnesium   Other Visit Diagnoses       Encounter for annual physical exam       Annual physical today with labs and health maintenance reviewed, discussed with patient.        Discussed aspirin prophylaxis for myocardial infarction prevention and decision was it was not indicated  LABORATORY TESTING:  Health maintenance labs ordered today as discussed above.   The natural history of prostate cancer and ongoing controversy regarding screening and potential treatment outcomes of prostate cancer has been discussed with the patient. The meaning of a false positive PSA and a false negative PSA has been discussed. He indicates understanding of the limitations of this screening test and wishes to proceed when 66 with screening PSA testing -- no family history of prostate cancer.   IMMUNIZATIONS:   - Tdap: Tetanus vaccination status reviewed: last tetanus booster within 10 years. - Influenza: Up to date - Pneumovax: Not applicable - Prevnar: refuses today - Zostavax vaccine: Not applicable  SCREENING: - Colonoscopy: Up to date  Discussed with patient purpose of the colonoscopy is to detect colon cancer at curable precancerous or early stages   - AAA  Screening: Not applicable  -Hearing Test: Not applicable  -Spirometry: Not applicable   PATIENT COUNSELING:    Sexuality: Discussed sexually transmitted diseases, partner selection, use of condoms, avoidance of unintended pregnancy  and contraceptive alternatives.   Advised to avoid cigarette smoking.  I discussed with the patient that most people either abstain from alcohol or drink within safe limits (<=14/week and <=4 drinks/occasion for males, <=7/weeks and <= 3 drinks/occasion for females) and that the risk for alcohol disorders and other health effects rises proportionally with the number of drinks per week and how often a drinker exceeds daily limits.  Discussed cessation/primary prevention of drug use and availability of treatment for abuse.   Diet: Encouraged to adjust caloric intake to maintain  or achieve ideal body weight, to reduce intake of dietary saturated fat and total fat, to limit sodium intake by avoiding high sodium foods and not adding table salt, and to maintain adequate dietary potassium and calcium preferably from fresh fruits, vegetables, and low-fat dairy products.    Stressed the importance of regular exercise  Injury prevention: Discussed safety belts, safety helmets, smoke detector, smoking  near bedding or upholstery.   Dental health: Discussed importance of regular tooth brushing, flossing, and dental visits.   Follow up plan: NEXT PREVENTATIVE PHYSICAL DUE IN 1 YEAR. Return in about 1 year (around 08/28/2024) for Annual Physical.

## 2023-08-29 NOTE — Assessment & Plan Note (Signed)
 Drinks two 16 oz beer a day, recommend cutting back on this which would be beneficial to BP and overall health.  Will obtain CMP, Mag, and CBC today.

## 2023-08-29 NOTE — Assessment & Plan Note (Signed)
 I have recommended complete cessation of tobacco use. I have discussed various options available for assistance with tobacco cessation including over the counter methods (Nicotine gum, patch and lozenges). We also discussed prescription options (Chantix, Nicotine Inhaler / Nasal Spray). The patient is not interested in pursuing any prescription tobacco cessation options at this time.

## 2023-08-30 ENCOUNTER — Ambulatory Visit: Payer: Self-pay | Admitting: Nurse Practitioner

## 2023-08-30 LAB — COMPREHENSIVE METABOLIC PANEL WITH GFR
ALT: 18 IU/L (ref 0–44)
AST: 25 IU/L (ref 0–40)
Albumin: 4.3 g/dL (ref 4.1–5.1)
Alkaline Phosphatase: 77 IU/L (ref 44–121)
BUN/Creatinine Ratio: 11 (ref 9–20)
BUN: 12 mg/dL (ref 6–24)
Bilirubin Total: <0.2 mg/dL (ref 0.0–1.2)
CO2: 23 mmol/L (ref 20–29)
Calcium: 9.4 mg/dL (ref 8.7–10.2)
Chloride: 102 mmol/L (ref 96–106)
Creatinine, Ser: 1.05 mg/dL (ref 0.76–1.27)
Globulin, Total: 2.1 g/dL (ref 1.5–4.5)
Glucose: 87 mg/dL (ref 70–99)
Potassium: 4.7 mmol/L (ref 3.5–5.2)
Sodium: 139 mmol/L (ref 134–144)
Total Protein: 6.4 g/dL (ref 6.0–8.5)
eGFR: 88 mL/min/{1.73_m2} (ref >59)

## 2023-08-30 LAB — CBC WITH DIFFERENTIAL/PLATELET
Basophils Absolute: 0 10*3/uL (ref 0.0–0.2)
Basos: 1 %
EOS (ABSOLUTE): 0.2 10*3/uL (ref 0.0–0.4)
Eos: 4 %
Hematocrit: 41 % (ref 37.5–51.0)
Hemoglobin: 13.5 g/dL (ref 13.0–17.7)
Immature Grans (Abs): 0 10*3/uL (ref 0.0–0.1)
Immature Granulocytes: 0 %
Lymphocytes Absolute: 2.2 10*3/uL (ref 0.7–3.1)
Lymphs: 56 %
MCH: 32.8 pg (ref 26.6–33.0)
MCHC: 32.9 g/dL (ref 31.5–35.7)
MCV: 100 fL — ABNORMAL HIGH (ref 79–97)
Monocytes Absolute: 0.3 10*3/uL (ref 0.1–0.9)
Monocytes: 8 %
Neutrophils Absolute: 1.2 10*3/uL — ABNORMAL LOW (ref 1.4–7.0)
Neutrophils: 31 %
Platelets: 277 10*3/uL (ref 150–450)
RBC: 4.11 x10E6/uL — ABNORMAL LOW (ref 4.14–5.80)
RDW: 13.6 % (ref 11.6–15.4)
WBC: 3.9 10*3/uL (ref 3.4–10.8)

## 2023-08-30 LAB — LIPID PANEL W/O CHOL/HDL RATIO
Cholesterol, Total: 180 mg/dL (ref 100–199)
HDL: 86 mg/dL (ref >39)
LDL Chol Calc (NIH): 86 mg/dL (ref 0–99)
Triglycerides: 35 mg/dL (ref 0–149)
VLDL Cholesterol Cal: 8 mg/dL (ref 5–40)

## 2023-08-30 LAB — MAGNESIUM: Magnesium: 2 mg/dL (ref 1.6–2.3)

## 2023-08-30 LAB — TSH: TSH: 0.598 u[IU]/mL (ref 0.450–4.500)

## 2023-08-30 NOTE — Progress Notes (Signed)
 Contacted via MyChart  Good morning Donald Oconnor, your labs have returned and overall they remain nice and stable.  A few mild abnormal numbers on CBC, but these are very mild and can fluctuate so we will continue to monitor.  Any questions? Keep being amazing!!  Thank you for allowing me to participate in your care.  I appreciate you. Kindest regards, Gilberto Streck

## 2024-09-01 ENCOUNTER — Encounter: Admitting: Nurse Practitioner
